# Patient Record
Sex: Female | Born: 1955
Health system: Southern US, Community
[De-identification: ages and names within clinical notes are randomized; demographics above are authoritative.]

## PROBLEM LIST (undated history)

## (undated) DIAGNOSIS — K56609 Unspecified intestinal obstruction, unspecified as to partial versus complete obstruction: Secondary | ICD-10-CM

## (undated) DIAGNOSIS — J40 Bronchitis, not specified as acute or chronic: Secondary | ICD-10-CM

## (undated) HISTORY — PX: ABDOMINAL SURGERY: SHX537

---

## 1998-07-10 ENCOUNTER — Inpatient Hospital Stay: Admission: EM | Admit: 1998-07-10 | Discharge: 1998-07-11 | Payer: Self-pay | Admitting: Emergency Medicine

## 1998-07-10 ENCOUNTER — Encounter: Payer: Self-pay | Admitting: Emergency Medicine

## 1998-07-11 ENCOUNTER — Encounter: Payer: Self-pay | Admitting: Family Medicine

## 1999-11-10 ENCOUNTER — Ambulatory Visit (HOSPITAL_COMMUNITY): Admission: RE | Admit: 1999-11-10 | Discharge: 1999-11-10 | Payer: Self-pay | Admitting: *Deleted

## 2003-06-11 ENCOUNTER — Emergency Department (HOSPITAL_COMMUNITY): Admission: EM | Admit: 2003-06-11 | Discharge: 2003-06-11 | Payer: Self-pay | Admitting: Emergency Medicine

## 2005-07-06 ENCOUNTER — Emergency Department (HOSPITAL_COMMUNITY): Admission: EM | Admit: 2005-07-06 | Discharge: 2005-07-06 | Payer: Self-pay | Admitting: Family Medicine

## 2005-08-29 ENCOUNTER — Emergency Department (HOSPITAL_COMMUNITY): Admission: EM | Admit: 2005-08-29 | Discharge: 2005-08-29 | Payer: Self-pay | Admitting: Emergency Medicine

## 2005-08-30 ENCOUNTER — Emergency Department (HOSPITAL_COMMUNITY): Admission: EM | Admit: 2005-08-30 | Discharge: 2005-08-30 | Payer: Self-pay | Admitting: Emergency Medicine

## 2007-01-01 ENCOUNTER — Emergency Department (HOSPITAL_COMMUNITY): Admission: EM | Admit: 2007-01-01 | Discharge: 2007-01-01 | Payer: Self-pay | Admitting: Emergency Medicine

## 2010-10-01 ENCOUNTER — Emergency Department (HOSPITAL_COMMUNITY)
Admission: EM | Admit: 2010-10-01 | Discharge: 2010-10-01 | Disposition: A | Discharge status: Home / Self Care | Payer: Managed Care, Other (non HMO) | Attending: Emergency Medicine | Admitting: Emergency Medicine

## 2010-10-01 ENCOUNTER — Emergency Department (HOSPITAL_COMMUNITY): Payer: Managed Care, Other (non HMO)

## 2010-10-01 ENCOUNTER — Encounter (HOSPITAL_COMMUNITY): Payer: Self-pay | Admitting: Radiology

## 2010-10-01 DIAGNOSIS — R0609 Other forms of dyspnea: Secondary | ICD-10-CM | POA: Insufficient documentation

## 2010-10-01 DIAGNOSIS — M549 Dorsalgia, unspecified: Secondary | ICD-10-CM | POA: Insufficient documentation

## 2010-10-01 DIAGNOSIS — R0989 Other specified symptoms and signs involving the circulatory and respiratory systems: Secondary | ICD-10-CM | POA: Insufficient documentation

## 2010-10-01 DIAGNOSIS — R630 Anorexia: Secondary | ICD-10-CM | POA: Insufficient documentation

## 2010-10-01 DIAGNOSIS — R10814 Left lower quadrant abdominal tenderness: Secondary | ICD-10-CM | POA: Insufficient documentation

## 2010-10-01 DIAGNOSIS — R0602 Shortness of breath: Secondary | ICD-10-CM | POA: Insufficient documentation

## 2010-10-01 DIAGNOSIS — R109 Unspecified abdominal pain: Secondary | ICD-10-CM | POA: Insufficient documentation

## 2010-10-01 HISTORY — DX: Unspecified intestinal obstruction, unspecified as to partial versus complete obstruction: K56.609

## 2010-10-01 HISTORY — DX: Bronchitis, not specified as acute or chronic: J40

## 2010-10-01 LAB — DIFFERENTIAL
Basophils Absolute: 0 10*3/uL (ref 0.0–0.1)
Basophils Relative: 0 % (ref 0–1)
Eosinophils Absolute: 0.2 10*3/uL (ref 0.0–0.7)
Eosinophils Relative: 4 % (ref 0–5)
Lymphocytes Relative: 56 % — ABNORMAL HIGH (ref 12–46)
Lymphs Abs: 2.9 10*3/uL (ref 0.7–4.0)
Monocytes Absolute: 0.4 K/uL (ref 0.1–1.0)
Monocytes Relative: 7 % (ref 3–12)
Neutro Abs: 1.7 K/uL (ref 1.7–7.7)
Neutrophils Relative %: 32 % — ABNORMAL LOW (ref 43–77)

## 2010-10-01 LAB — COMPREHENSIVE METABOLIC PANEL WITH GFR
ALT: 27 U/L (ref 0–35)
AST: 24 U/L (ref 0–37)
BUN: 14 mg/dL (ref 6–23)
Total Protein: 7.5 g/dL (ref 6.0–8.3)

## 2010-10-01 LAB — COMPREHENSIVE METABOLIC PANEL
Albumin: 3.4 g/dL — ABNORMAL LOW (ref 3.5–5.2)
Alkaline Phosphatase: 93 U/L (ref 39–117)
CO2: 27 mEq/L (ref 19–32)
Calcium: 8.8 mg/dL (ref 8.4–10.5)
Chloride: 105 mEq/L (ref 96–112)
Creatinine, Ser: <0.47 mg/dL — ABNORMAL LOW (ref 0.50–1.10)
Glucose, Bld: 100 mg/dL — ABNORMAL HIGH (ref 70–99)
Potassium: 3.5 mEq/L (ref 3.5–5.1)
Sodium: 140 mEq/L (ref 135–145)
Total Bilirubin: 0.3 mg/dL (ref 0.3–1.2)

## 2010-10-01 LAB — URINALYSIS, ROUTINE W REFLEX MICROSCOPIC
Bilirubin Urine: NEGATIVE
Glucose, UA: NEGATIVE mg/dL
Hgb urine dipstick: NEGATIVE
Ketones, ur: 15 mg/dL — AB
Leukocytes, UA: NEGATIVE
Nitrite: NEGATIVE
Protein, ur: NEGATIVE mg/dL
Specific Gravity, Urine: 1.013 (ref 1.005–1.030)
Urobilinogen, UA: 1 mg/dL (ref 0.0–1.0)
pH: 6.5 (ref 5.0–8.0)

## 2010-10-01 LAB — CBC
HCT: 36.7 % (ref 36.0–46.0)
Hemoglobin: 12.2 g/dL (ref 12.0–15.0)
MCH: 29.5 pg (ref 26.0–34.0)
MCHC: 33.2 g/dL (ref 30.0–36.0)
MCV: 88.9 fL (ref 78.0–100.0)
Platelets: 383 K/uL (ref 150–400)
RBC: 4.13 MIL/uL (ref 3.87–5.11)
RDW: 12.1 % (ref 11.5–15.5)
WBC: 5.1 K/uL (ref 4.0–10.5)

## 2010-10-01 LAB — CK TOTAL AND CKMB (NOT AT ARMC)
CK, MB: 2.6 ng/mL (ref 0.3–4.0)
Relative Index: INVALID (ref 0.0–2.5)
Total CK: 87 U/L (ref 7–177)

## 2010-10-01 LAB — LIPASE, BLOOD: Lipase: 22 U/L (ref 11–59)

## 2010-10-01 LAB — TROPONIN I: Troponin I: <0.3 ng/mL (ref <0.30)

## 2010-10-01 MED ORDER — IOHEXOL 300 MG/ML  SOLN
80.0000 mL | Freq: Once | INTRAMUSCULAR | Status: AC | PRN
  Administered 2010-10-01: 80 mL via INTRAVENOUS

## 2011-03-24 ENCOUNTER — Emergency Department (HOSPITAL_COMMUNITY)
Admission: EM | Admit: 2011-03-24 | Discharge: 2011-03-24 | Disposition: A | Discharge status: Home / Self Care | Payer: Managed Care, Other (non HMO) | Attending: Emergency Medicine | Admitting: Emergency Medicine

## 2011-03-24 ENCOUNTER — Emergency Department (HOSPITAL_COMMUNITY): Payer: Managed Care, Other (non HMO)

## 2011-03-24 ENCOUNTER — Encounter (HOSPITAL_COMMUNITY): Payer: Self-pay | Admitting: Emergency Medicine

## 2011-03-24 DIAGNOSIS — R1310 Dysphagia, unspecified: Secondary | ICD-10-CM | POA: Insufficient documentation

## 2011-03-24 DIAGNOSIS — R059 Cough, unspecified: Secondary | ICD-10-CM | POA: Insufficient documentation

## 2011-03-24 DIAGNOSIS — J40 Bronchitis, not specified as acute or chronic: Secondary | ICD-10-CM

## 2011-03-24 DIAGNOSIS — R079 Chest pain, unspecified: Secondary | ICD-10-CM | POA: Insufficient documentation

## 2011-03-24 DIAGNOSIS — R51 Headache: Secondary | ICD-10-CM | POA: Insufficient documentation

## 2011-03-24 DIAGNOSIS — J329 Chronic sinusitis, unspecified: Secondary | ICD-10-CM

## 2011-03-24 DIAGNOSIS — J3489 Other specified disorders of nose and nasal sinuses: Secondary | ICD-10-CM | POA: Insufficient documentation

## 2011-03-24 DIAGNOSIS — R0682 Tachypnea, not elsewhere classified: Secondary | ICD-10-CM | POA: Insufficient documentation

## 2011-03-24 DIAGNOSIS — R630 Anorexia: Secondary | ICD-10-CM | POA: Insufficient documentation

## 2011-03-24 DIAGNOSIS — R05 Cough: Secondary | ICD-10-CM | POA: Insufficient documentation

## 2011-03-24 DIAGNOSIS — R0602 Shortness of breath: Secondary | ICD-10-CM | POA: Insufficient documentation

## 2011-03-24 DIAGNOSIS — R599 Enlarged lymph nodes, unspecified: Secondary | ICD-10-CM | POA: Insufficient documentation

## 2011-03-24 MED ORDER — ALBUTEROL SULFATE HFA 108 (90 BASE) MCG/ACT IN AERS
2.0000 | INHALATION_SPRAY | Freq: Once | RESPIRATORY_TRACT | Status: AC
  Administered 2011-03-24: 2 via RESPIRATORY_TRACT
  Filled 2011-03-24: qty 6.7

## 2011-03-24 MED ORDER — AMOXICILLIN-POT CLAVULANATE 875-125 MG PO TABS
1.0000 | ORAL_TABLET | ORAL | Status: AC
  Administered 2011-03-24: 1 via ORAL
  Filled 2011-03-24: qty 1

## 2011-03-24 MED ORDER — HYDROCOD POLST-CHLORPHEN POLST 10-8 MG/5ML PO LQCR
5.0000 mL | Freq: Once | ORAL | Status: AC
  Administered 2011-03-24: 5 mL via ORAL
  Filled 2011-03-24: qty 5

## 2011-03-24 MED ORDER — AMOXICILLIN-POT CLAVULANATE 875-125 MG PO TABS: 1.0000 | ORAL_TABLET | Freq: Two times a day (BID) | ORAL | Status: AC

## 2011-03-24 MED ORDER — HYDROCOD POLST-CHLORPHEN POLST 10-8 MG/5ML PO LQCR: 5.0000 mL | Freq: Two times a day (BID) | ORAL | Status: DC | PRN

## 2011-03-24 NOTE — ED Provider Notes (Signed)
History     CSN: [id number] Arrival date & time: 03/24/2011  9:52 PM   First MD Initiated Contact with Patient 03/24/11 2308      Chief Complaint  Patient presents with  . Shortness of Breath    (Consider location/radiation/quality/duration/timing/severity/associated sxs/prior treatment) HPI Comments: A level V caveat applies to 2 language barrier. History of this is obtained from the patient's daughter who speaks fluent English and in her knees. For proximally one week the patient has been having worsening chest pain, worse with coughing. She has a productive cough as well as nasal drainage. She has pain in her face, nose, sinuses also worse with coughing. She was trying some DayQuil recently without any significant relief. Unsure of fevers, there is poor appetite and patient reports pain with trying to swallow food therefore has been only drinking liquids. There is a prior history of bronchitis in the past. Patient does not smoke cigarettes. Patient reports chest pain is improved with laying flat and gets worse with sitting up. Pain does not radiate to stomach or back. Patient also complains of generalized headache. There is no nausea vomiting or diarrhea.  Patient is a 55 y.o. female presenting with shortness of breath. The history is provided by the patient and a relative. The history is limited by a language barrier.  Shortness of Breath  Associated symptoms include cough and shortness of breath.    Past Medical History  Diagnosis Date  . Bowel obstruction   . Bronchitis     History reviewed. No pertinent past surgical history.  History reviewed. No pertinent family history.  History  Substance Use Topics  . Smoking status: Never Smoker   . Smokeless tobacco: Not on file  . Alcohol Use: No    OB History    Grav Para Term Preterm Abortions TAB SAB Ect Mult Living                  Review of Systems  Constitutional: Positive for appetite change.  HENT: Positive for  congestion, trouble swallowing and sinus pressure.   Respiratory: Positive for cough and shortness of breath.   Gastrointestinal: Negative for nausea, vomiting and diarrhea.  Neurological: Positive for headaches.    Allergies  Review of patient's allergies indicates no known allergies.  Home Medications   Current Outpatient Rx  Name Route Sig Dispense Refill  . PSEUDOEPHEDRINE-APAP-DM 60-650-20 MG/30ML PO LIQD Oral Take 15 mLs by mouth daily as needed. For cough per daughter       BP 107/64  Pulse 107  Temp(Src) 98.1 F (36.7 C) (Oral)  Resp 20  SpO2 97%  Physical Exam  Vitals reviewed. Constitutional: She is oriented to person, place, and time. She appears well-developed and well-nourished.  HENT:  Head: Normocephalic and atraumatic.  Nose: Mucosal edema, rhinorrhea and sinus tenderness present. No septal deviation or nasal septal hematoma. No epistaxis. Right sinus exhibits maxillary sinus tenderness and frontal sinus tenderness. Left sinus exhibits maxillary sinus tenderness and frontal sinus tenderness.  Mouth/Throat: Uvula is midline and oropharynx is clear and moist. Mucous membranes are dry and not cyanotic.  Cardiovascular: Regular rhythm, S1 normal, S2 normal and normal pulses.  Exam reveals no gallop and no friction rub.   No murmur heard. Pulmonary/Chest: No accessory muscle usage. Tachypnea noted. No respiratory distress. She has no decreased breath sounds. She has no wheezes. She has no rhonchi. She has no rales.       + coughing  Abdominal: Soft. Normal appearance. There is  no rebound and no guarding.  Musculoskeletal: Normal range of motion. She exhibits no edema and no tenderness.  Lymphadenopathy:    She has cervical adenopathy.  Neurological: She is alert and oriented to person, place, and time.  Skin: No rash noted.    ED Course  Procedures (including critical care time)  Labs Reviewed - No data to display Dg Chest 2 View  03/24/2011  *RADIOLOGY  REPORT*  Clinical Data: Shortness of breath with cough and fever.  CHEST - 2 VIEW  Comparison: 01/01/2007.  Chest CT from 10/01/2010.  Findings: The lungs are clear without focal infiltrate, edema, pneumothorax or pleural effusion. Interstitial markings are diffusely coarsened with chronic features. The cardiopericardial silhouette is within normal limits for size. Bones are diffusely demineralized.  IMPRESSION: No acute cardiopulmonary findings.  Original Report Authenticated By: ERIC A. MANSELL, M.D.     No diagnosis found.    MDM    Pt with evidence of upper respiratory infection . I reviewed plain films, not good inspiration, per radiologist cannot exclude effusion, however history and exam do not support pericardial effusion.  Pt's symptoms are worse with coughing so I think it is musculoskeletal.  Pt with sinus symptoms for more than 1 week, will start on augmentin, will also provide inhaler and tussionex for symptoms.  VS are ok, RA sats are 97%, which is normal.  Pt appears mildly dehydrated, but pt is drinking fluids, I don't think IVF's are necessary at this time.          Gavin Pound. Oletta Lamas, MD 03/24/11 [id number]

## 2011-03-24 NOTE — ED Notes (Signed)
PT. REPORTS PROGRESSING SOB WITH PRODUCTIVE COUGH AND FEVER FOR SEVERAL DAYS . DAUGHTER TRANSLATING FOR PT- [place].

## 2011-03-24 NOTE — Discharge Instructions (Signed)
 Use the inhaler 2 puffs every 6 hours for coughing and shortness of breath.  Take antibiotics for full 10 days as prescribed. Tussionex contains hydrocodone which is narcotic. It can cause drowsiness and sometimes constipation so use with caution and take precautions.

## 2011-07-30 ENCOUNTER — Emergency Department (HOSPITAL_COMMUNITY): Payer: Managed Care, Other (non HMO)

## 2011-07-30 ENCOUNTER — Encounter (HOSPITAL_COMMUNITY): Payer: Self-pay | Admitting: Emergency Medicine

## 2011-07-30 ENCOUNTER — Emergency Department (HOSPITAL_COMMUNITY)
Admission: EM | Admit: 2011-07-30 | Discharge: 2011-07-30 | Disposition: A | Discharge status: Home / Self Care | Payer: Managed Care, Other (non HMO) | Attending: Emergency Medicine | Admitting: Emergency Medicine

## 2011-07-30 DIAGNOSIS — R0989 Other specified symptoms and signs involving the circulatory and respiratory systems: Secondary | ICD-10-CM | POA: Insufficient documentation

## 2011-07-30 DIAGNOSIS — R093 Abnormal sputum: Secondary | ICD-10-CM | POA: Insufficient documentation

## 2011-07-30 DIAGNOSIS — R05 Cough: Secondary | ICD-10-CM | POA: Insufficient documentation

## 2011-07-30 DIAGNOSIS — R059 Cough, unspecified: Secondary | ICD-10-CM | POA: Insufficient documentation

## 2011-07-30 DIAGNOSIS — R062 Wheezing: Secondary | ICD-10-CM | POA: Insufficient documentation

## 2011-07-30 DIAGNOSIS — J40 Bronchitis, not specified as acute or chronic: Secondary | ICD-10-CM | POA: Insufficient documentation

## 2011-07-30 DIAGNOSIS — R0602 Shortness of breath: Secondary | ICD-10-CM | POA: Insufficient documentation

## 2011-07-30 DIAGNOSIS — R0609 Other forms of dyspnea: Secondary | ICD-10-CM | POA: Insufficient documentation

## 2011-07-30 MED ORDER — ALBUTEROL SULFATE (5 MG/ML) 0.5% IN NEBU
INHALATION_SOLUTION | RESPIRATORY_TRACT | Status: AC
  Filled 2011-07-30: qty 1

## 2011-07-30 MED ORDER — ALBUTEROL SULFATE (5 MG/ML) 0.5% IN NEBU
5.0000 mg | INHALATION_SOLUTION | Freq: Once | RESPIRATORY_TRACT | Status: AC
  Administered 2011-07-30: 5 mg via RESPIRATORY_TRACT

## 2011-07-30 MED ORDER — IPRATROPIUM BROMIDE 0.02 % IN SOLN
0.5000 mg | Freq: Once | RESPIRATORY_TRACT | Status: AC
  Administered 2011-07-30: 0.5 mg via RESPIRATORY_TRACT
  Filled 2011-07-30: qty 2.5

## 2011-07-30 MED ORDER — SULFAMETHOXAZOLE-TRIMETHOPRIM 800-160 MG PO TABS: 1.0000 | ORAL_TABLET | Freq: Two times a day (BID) | ORAL | Status: DC

## 2011-07-30 MED ORDER — PREDNISONE 20 MG PO TABS
60.0000 mg | ORAL_TABLET | Freq: Once | ORAL | Status: AC
  Administered 2011-07-30: 60 mg via ORAL
  Filled 2011-07-30: qty 3

## 2011-07-30 MED ORDER — ALBUTEROL SULFATE (5 MG/ML) 0.5% IN NEBU
2.5000 mg | INHALATION_SOLUTION | Freq: Once | RESPIRATORY_TRACT | Status: AC
  Administered 2011-07-30: 2.5 mg via RESPIRATORY_TRACT
  Filled 2011-07-30: qty 0.5

## 2011-07-30 MED ORDER — PREDNISONE 20 MG PO TABS: 60.0000 mg | ORAL_TABLET | Freq: Every day | ORAL | Status: DC

## 2011-07-30 MED ORDER — ALBUTEROL SULFATE HFA 108 (90 BASE) MCG/ACT IN AERS
2.0000 | INHALATION_SPRAY | RESPIRATORY_TRACT | Status: DC | PRN
  Administered 2011-07-30: 2 via RESPIRATORY_TRACT
  Filled 2011-07-30: qty 6.7

## 2011-07-30 NOTE — ED Notes (Signed)
Pt [place], pt okay's for son to translate; pt started to have SOB 10 pm; son reports that she is not on any inhalers; pt appears labored, insp wheezing, pt reports midsternal CP, sore throat, denies fevers, pt having productive cough

## 2011-07-30 NOTE — ED Notes (Signed)
Patient with shortness of breath, insp wheezing and exp wheezing on the left.  Patient is CAOx3, does not speak english.  Patient's son states she started with shortness of breath around 10pm, increasingly getting more short of breath.

## 2011-07-30 NOTE — ED Provider Notes (Signed)
History     CSN: [id number]  Arrival date & time 07/30/11  0142   First MD Initiated Contact with Patient 07/30/11 0216      Chief Complaint  Patient presents with  . Shortness of Breath    (Consider location/radiation/quality/duration/timing/severity/associated sxs/prior treatment) Patient is a 56 y.o. female presenting with shortness of breath. The history is provided by a relative.  Shortness of Breath  Associated symptoms include cough and shortness of breath. Pertinent negatives include no chest pain and no fever.   the patient is a 56 year old, female, with no significant past medical history.  She does not smoke.  She presents to the emergency department complaining of a cough with productive sputum since around 10 PM.  This evening.  She also shortness of breath.  She says her sputum is green.  She has not had a fever, or chills.  She denies chest pain.  Past Medical History  Diagnosis Date  . Bowel obstruction   . Bronchitis     Past Surgical History  Procedure Date  . Abdominal surgery     History reviewed. No pertinent family history.  History  Substance Use Topics  . Smoking status: Never Smoker   . Smokeless tobacco: Not on file  . Alcohol Use: No    OB History    Grav Para Term Preterm Abortions TAB SAB Ect Mult Living                  Review of Systems  Constitutional: Negative for fever and chills.  HENT: Negative for congestion.   Respiratory: Positive for cough and shortness of breath. Negative for chest tightness.   Cardiovascular: Negative for chest pain and leg swelling.  Neurological: Negative for headaches.  Psychiatric/Behavioral: Negative for confusion.  All other systems reviewed and are negative.    Allergies  Review of patient's allergies indicates no known allergies.  Home Medications   Current Outpatient Rx  Name Route Sig Dispense Refill  . OVER THE COUNTER MEDICATION Oral Take 1 tablet by mouth daily as needed. For  allergies/sleep  Over the counter allergy relief      BP 127/78  Pulse 99  Temp(Src) 97.5 F (36.4 C) (Oral)  Resp 24  SpO2 90%  Physical Exam  Vitals reviewed. Constitutional: She is oriented to person, place, and time. She appears well-developed and well-nourished. She appears distressed.  HENT:  Head: Normocephalic and atraumatic.  Eyes: Conjunctivae are normal.  Neck: Normal range of motion. Neck supple.  Cardiovascular: Normal rate and regular rhythm.   Pulmonary/Chest: She is in respiratory distress. She has wheezes. She has no rales.  Abdominal: She exhibits no distension.  Musculoskeletal: Normal range of motion. She exhibits no edema.  Neurological: She is alert and oriented to person, place, and time.  Skin: Skin is warm and dry.  Psychiatric: She has a normal mood and affect. Thought content normal.    ED Course  Procedures (including critical care time) Asthma with respiratory distress and room air, hypoxia.  We'll perform a chest x-ray, and give nebulizer treatments and steroids.  Labs Reviewed - No data to display No results found.   No diagnosis found.  2:52 AM Feels better after nebs  3:45 AM Lungs cta now. No resp distress. Feels better.  MDM  Bronchitis No resp distress        Cheri Guppy, MD 07/30/11 [phone]

## 2011-07-30 NOTE — ED Notes (Signed)
Pt started on protocol nebulizer at triage and tx to pod9 by rn

## 2011-07-30 NOTE — Discharge Instructions (Signed)
Your chest x-ray, shows that you may have bronchitis or early pneumonia.  Use albuterol for recurrent wheezing and shortness of breath.  Use prednisone to reduce inflammation in your lungs and reduce symptoms.  Use Bactrim for infection.  Followup with your Dr. if your symptoms.  Do not improve after 3-4 days.  Return for worse or uncontrolled symptoms.  You should have a repeat chest x-ray, in one month to see if everything has resolved or if there is any progression of lymph nodes in your lungs

## 2011-07-31 ENCOUNTER — Emergency Department (HOSPITAL_COMMUNITY)
Admission: EM | Admit: 2011-07-31 | Discharge: 2011-07-31 | Disposition: A | Discharge status: Home / Self Care | Payer: Managed Care, Other (non HMO) | Attending: Emergency Medicine | Admitting: Emergency Medicine

## 2011-07-31 ENCOUNTER — Emergency Department (HOSPITAL_COMMUNITY): Payer: Managed Care, Other (non HMO)

## 2011-07-31 ENCOUNTER — Encounter (HOSPITAL_COMMUNITY): Payer: Self-pay | Admitting: Emergency Medicine

## 2011-07-31 ENCOUNTER — Other Ambulatory Visit: Payer: Self-pay

## 2011-07-31 DIAGNOSIS — J40 Bronchitis, not specified as acute or chronic: Secondary | ICD-10-CM | POA: Insufficient documentation

## 2011-07-31 DIAGNOSIS — J9801 Acute bronchospasm: Secondary | ICD-10-CM | POA: Insufficient documentation

## 2011-07-31 LAB — DIFFERENTIAL
Basophils Absolute: 0.1 10*3/uL (ref 0.0–0.1)
Lymphocytes Relative: 53 % — ABNORMAL HIGH (ref 12–46)
Monocytes Absolute: 0.4 10*3/uL (ref 0.1–1.0)
Neutro Abs: 2.2 10*3/uL (ref 1.7–7.7)
Neutrophils Relative %: 32 % — ABNORMAL LOW (ref 43–77)

## 2011-07-31 LAB — BASIC METABOLIC PANEL
CO2: 26 mEq/L (ref 19–32)
Chloride: 105 mEq/L (ref 96–112)
Creatinine, Ser: 0.54 mg/dL (ref 0.50–1.10)
GFR calc Af Amer: >90 mL/min (ref >90)
Potassium: 3.5 mEq/L (ref 3.5–5.1)
Sodium: 142 mEq/L (ref 135–145)

## 2011-07-31 LAB — CBC
HCT: 37.8 % (ref 36.0–46.0)
RDW: 13 % (ref 11.5–15.5)
WBC: 6.8 10*3/uL (ref 4.0–10.5)

## 2011-07-31 MED ORDER — METHYLPREDNISOLONE SODIUM SUCC 125 MG IJ SOLR
125.0000 mg | Freq: Once | INTRAMUSCULAR | Status: AC
  Administered 2011-07-31: 125 mg via INTRAVENOUS
  Filled 2011-07-31: qty 2

## 2011-07-31 MED ORDER — ACETAMINOPHEN-CODEINE 120-12 MG/5ML PO SOLN: 10.0000 mL | Freq: Four times a day (QID) | ORAL | Status: AC | PRN

## 2011-07-31 MED ORDER — ALBUTEROL SULFATE (5 MG/ML) 0.5% IN NEBU
2.5000 mg | INHALATION_SOLUTION | Freq: Once | RESPIRATORY_TRACT | Status: AC
  Administered 2011-07-31: 2.5 mg via RESPIRATORY_TRACT

## 2011-07-31 MED ORDER — IPRATROPIUM BROMIDE 0.02 % IN SOLN
0.5000 mg | Freq: Once | RESPIRATORY_TRACT | Status: AC
  Administered 2011-07-31: 0.5 mg via RESPIRATORY_TRACT
  Filled 2011-07-31: qty 2.5

## 2011-07-31 MED ORDER — IPRATROPIUM BROMIDE 0.02 % IN SOLN
0.5000 mg | Freq: Once | RESPIRATORY_TRACT | Status: AC
  Administered 2011-07-31: 0.5 mg via RESPIRATORY_TRACT

## 2011-07-31 MED ORDER — IPRATROPIUM BROMIDE 0.02 % IN SOLN
RESPIRATORY_TRACT | Status: AC
  Filled 2011-07-31: qty 2.5

## 2011-07-31 MED ORDER — ALBUTEROL SULFATE (5 MG/ML) 0.5% IN NEBU
INHALATION_SOLUTION | RESPIRATORY_TRACT | Status: AC
  Filled 2011-07-31: qty 0.5

## 2011-07-31 MED ORDER — ALBUTEROL SULFATE (5 MG/ML) 0.5% IN NEBU
2.5000 mg | INHALATION_SOLUTION | Freq: Once | RESPIRATORY_TRACT | Status: AC
  Administered 2011-07-31: 2.5 mg via RESPIRATORY_TRACT
  Filled 2011-07-31: qty 0.5

## 2011-07-31 NOTE — ED Notes (Addendum)
Pt ambulated in hall without difficulty, pt O2 sat on room air while ambulating was 97% HR 117, pt coughing while walking O2 sat remained above 94% HR increased to 125 bpm

## 2011-07-31 NOTE — ED Notes (Signed)
Patient did not pick up e-prescribed medications from last visit; has not been taking medications.

## 2011-07-31 NOTE — ED Provider Notes (Signed)
History     CSN: [id number]  Arrival date & time 07/31/11  0704   First MD Initiated Contact with Patient 07/31/11 [phone]      Chief Complaint  Patient presents with  . Shortness of Breath    (Consider location/radiation/quality/duration/timing/severity/associated sxs/prior treatment) Patient is a 56 y.o. female presenting with shortness of breath. The history is provided by the patient and a relative.  Shortness of Breath  Associated symptoms include shortness of breath.  She has had a cough for the last 2 days. Cough was initially productive of green sputum but is now nonproductive. There has been associated dyspnea and wheezing. In any fever, chills, sweats. She denies nausea, vomiting, diarrhea. She denies arthralgias and myalgias. There's been no known sick contacts. She was in the emergency department yesterday and was given an albuterol inhaler which has not been helping. She was given prescriptions but has not had them filled.  Past Medical History  Diagnosis Date  . Bowel obstruction   . Bronchitis     Past Surgical History  Procedure Date  . Abdominal surgery     History reviewed. No pertinent family history.  History  Substance Use Topics  . Smoking status: Never Smoker   . Smokeless tobacco: Not on file  . Alcohol Use: No    OB History    Grav Para Term Preterm Abortions TAB SAB Ect Mult Living                  Review of Systems  Respiratory: Positive for shortness of breath.   All other systems reviewed and are negative.    Allergies  Review of patient's allergies indicates no known allergies.  Home Medications   Current Outpatient Rx  Name Route Sig Dispense Refill  . OVER THE COUNTER MEDICATION Oral Take 1 tablet by mouth daily as needed. For allergies/sleep  Over the counter allergy relief      BP 133/78  Pulse 101  Temp(Src) 98.3 F (36.8 C) (Oral)  Resp 22  SpO2 90%  Physical Exam  Nursing note and vitals reviewed.  T41-year-old  female who appears mildly dyspneic. Vital signs are significant for mild tachycardia heart rate 101, mild tachypnea with respiratory rate of 22. Saturation is 90% which is hypoxic. Normocephalic and atraumatic. PERRLA, EOMI. Oropharynx is clear. Neck is nontender and supple without adenopathy or JVD. Back is nontender. Lungs have diffuse inspiratory and expiratory wheezes and she is using some accessory muscles of respiration. Heart has regular rate and rhythm without murmur. Abdomen is soft, flat, nontender without masses or hepatosplenomegaly. Extremities have full range of motion, no cyanosis or edema. Skin is warm and dry without rash. Neurologic: Mental status is normal, cranial nerves are intact, there are no focal motor or sensory deficits.  ED Course  Procedures (including critical care time)  Results for orders placed during the hospital encounter of 07/31/11  CBC      Component Value Range   WBC 6.8  4.0 - 10.5 (K/uL)   RBC 4.17  3.87 - 5.11 (MIL/uL)   Hemoglobin 12.6  12.0 - 15.0 (g/dL)   HCT 11.9  14.7 - 82.9 (%)   MCV 90.6  78.0 - 100.0 (fL)   MCH 30.2  26.0 - 34.0 (pg)   MCHC 33.3  30.0 - 36.0 (g/dL)   RDW 56.2  13.0 - 86.5 (%)   Platelets 331  150 - 400 (K/uL)  DIFFERENTIAL      Component Value Range   Neutrophils  Relative 32 (*) 43 - 77 (%)   Neutro Abs 2.2  1.7 - 7.7 (K/uL)   Lymphocytes Relative 53 (*) 12 - 46 (%)   Lymphs Abs 3.6  0.7 - 4.0 (K/uL)   Monocytes Relative 6  3 - 12 (%)   Monocytes Absolute 0.4  0.1 - 1.0 (K/uL)   Eosinophils Relative 8 (*) 0 - 5 (%)   Eosinophils Absolute 0.5  0.0 - 0.7 (K/uL)   Basophils Relative 1  0 - 1 (%)   Basophils Absolute 0.1  0.0 - 0.1 (K/uL)  BASIC METABOLIC PANEL      Component Value Range   Sodium 142  135 - 145 (mEq/L)   Potassium 3.5  3.5 - 5.1 (mEq/L)   Chloride 105  96 - 112 (mEq/L)   CO2 26  19 - 32 (mEq/L)   Glucose, Bld 123 (*) 70 - 99 (mg/dL)   BUN 17  6 - 23 (mg/dL)   Creatinine, Ser 1.61  0.50 - 1.10 (mg/dL)     Calcium 9.0  8.4 - 10.5 (mg/dL)   GFR calc non Af Amer >90  >90 (mL/min)   GFR calc Af Amer >90  >90 (mL/min)   Dg Chest 2 View  07/31/2011  *RADIOLOGY REPORT*  Clinical Data: Shortness of breath.  Difficulty breathing.  CHEST - 2 VIEW  Comparison: 07/30/2011  Findings: Cardiomediastinal silhouette is within normal limits. There is perihilar bronchitic change.  No focal consolidations or pleural effusions are identified. Visualized osseous structures have a normal appearance.  IMPRESSION:  1. No focal pulmonary abnormality. 2.  Bronchitic changes.  Original Report Authenticated By: Patterson Hammersmith, M.D.   ECG shows normal sinus rhythm with a rate of 100, no ectopy. Normal axis. Normal P wave. Normal QRS. Normal intervals. Normal ST and T waves. Impression: normal ECG. When compared with ECG of 07/30/2011, no significant changes are seen.  0940: Lungs are completely clear but she states she still feels like there is a little difficulty breathing. X-ray is negative for infiltrate and WBC has a right shift suggesting a viral illness. Oxygen saturation on nasal cannula is 100%. At this point, I will reevaluate to see what her oxygen saturation is on room air.  She was given a second albuterol nebulizer treatment and is resting comfortably. Oxygen sats saturation on room air was 98%. She was ambulated and oxygen saturation did not drop below 94%. She is requesting a prescription for a cough suppressant. She is advised to make sure she picks up the prednisone prescription she was given yesterday and start taking that tomorrow, and is given a prescription for acetaminophen with codeine elixir for cough.  1. Bronchospasm   2. Bronchitis       MDM   ED chart was reviewed and she had been prescribed Bactrim and prednisone but apparently these have not been picked up. She had been given an albuterol inhaler in the emergency department. Chest x-ray was read as showing questionable perihilar infiltrate.  He has significant/and hypoxia. She will be given additional steroids to nebulizer treatments and evaluated for possible admission.        Dione Booze, MD 07/31/11 1309

## 2011-07-31 NOTE — Discharge Instructions (Signed)
Make sure to pick up the medication you were prescribed yesterday!  Bronchospasm, Adult Bronchospasm means that there is a spasm or tightening of the airways going into the lungs. Because the airways go into a spasm and get smaller it makes breathing more difficult. For reasons not completely known, workings (functions) of the airways designed to protect the lungs become over active. This causes the airways to become more sensitive to:  Infection.   Weather.   Exercise.   Irritants.   Things that cause allergic reactions or allergies (allergens).  Frequent coughing or respiratory episodes should be checked for the cause. This condition may be made worse by exercise. CAUSES  Inflammation is often the cause of this condition. Allergy, viral respiratory infections, or irritants in the air often cause this problem. Allergic reactions produce immediate and delayed responses. Late reactions may produce more serious inflammation. This may lead to increased reactivity of the airways. Sometimes this is inherited. Some common triggers are:  Allergies.   Infection commonly triggers attacks. Antibiotics are not helpful for viral infections and usually do not help with attacks of bronchospasm.   Exercise (running, etc.) can trigger an attack. Proper pre-exercise medications help most individuals participate in sports. Swimming is the least likely sport to cause problems.   Irritants (for example, pollution, cigarette smoke, strong odors, aerosol sprays, paint fumes, etc.) may trigger attacks. You cannot smoke and do not allow smoking in your home. This is absolutely necessary. Show this instruction to mates, relatives and significant others that may not agree with you.   Weather changes may cause lung problems but moving around trying to find an ideal climate does not seem to be overly helpful. Winds increase molds and pollens in the air. Rain refreshes the air by washing irritants out. Cold air may  cause irritation.   Emotional problems do not cause lung problems but can trigger attacks.  SYMPTOMS  Wheezing is the most common symptom. Frequent coughing (with or without exercise and or crying) and repeated respiratory infections are all early warning signs of bronchospasm. Chest tightness and shortness of breath are other symptoms. DIAGNOSIS  Early hidden bronchospasm may go for long periods of time without being detected. This is especially true if wheezing cannot be detected by your caregiver. Lung (pulmonary) function studies may help with diagnosis in these cases. HOME CARE INSTRUCTIONS   It is necessary to remain calm during an attack. Try to relax and breathe more slowly. During this time medications may be given. If any breathing problems seem to be getting worse and are unresponsive to treatment seek immediate medical care.   If you have severe breathing difficulty or have had a life threatening attack it is probably a good idea for you to learn how to give adrenaline (epi-pen) or use an anaphylaxis kit. Your caregiver can help you with this. These are the same kits carried by people who have severe allergic reactions. This is especially important if you do not have readily accessible medical care.   With any severe breathing problems where epinephrine (adrenaline) has been given at home call 911 immediately as the delayed reaction may be even more severe.  SEEK MEDICAL CARE IF:   There is wheezing and shortness of breath, even if medications are given to prevent attacks.   An oral temperature above 102 F (38.9 C) develops.   There are muscle aches, chest pain, or thickening of sputum.   The sputum changes from clear or white to yellow, green, gray,  or bloody.   There are problems that may be related to the medicine you are given, such as a rash, itching, swelling, or trouble breathing.  SEEK IMMEDIATE MEDICAL CARE IF:   The usual medicines do not stop your wheezing, or  there is increased coughing.   You have increased difficulty breathing.  MAKE SURE YOU:   Understand these instructions.   Will watch your condition.   Will get help right away if you are not doing well or get worse.  Document Released: 03/31/2003 Document Revised: 03/17/2011 Document Reviewed: 11/14/2007 [facility] Patient Information 2012 [place], [place].

## 2011-07-31 NOTE — ED Notes (Addendum)
Patient complaining of shortness of breath since Friday; was seen in the ED on Friday.  Patient states symptoms are not better; 90% O2 on RA.  Patient also reports pain chest pain when coughing; reports productive cough.  Primary language [place] -- family member at bedside to translate.

## 2011-08-10 ENCOUNTER — Other Ambulatory Visit: Payer: Self-pay | Admitting: Internal Medicine

## 2011-08-16 ENCOUNTER — Other Ambulatory Visit: Payer: Self-pay

## 2011-08-16 ENCOUNTER — Emergency Department (HOSPITAL_COMMUNITY): Payer: Managed Care, Other (non HMO)

## 2011-08-16 ENCOUNTER — Encounter (HOSPITAL_COMMUNITY): Payer: Self-pay | Admitting: Emergency Medicine

## 2011-08-16 ENCOUNTER — Emergency Department (HOSPITAL_COMMUNITY)
Admission: EM | Admit: 2011-08-16 | Discharge: 2011-08-16 | Disposition: A | Discharge status: Home / Self Care | Payer: Managed Care, Other (non HMO) | Attending: Emergency Medicine | Admitting: Emergency Medicine

## 2011-08-16 DIAGNOSIS — R0602 Shortness of breath: Secondary | ICD-10-CM | POA: Insufficient documentation

## 2011-08-16 DIAGNOSIS — R05 Cough: Secondary | ICD-10-CM | POA: Insufficient documentation

## 2011-08-16 DIAGNOSIS — J40 Bronchitis, not specified as acute or chronic: Secondary | ICD-10-CM | POA: Insufficient documentation

## 2011-08-16 DIAGNOSIS — R059 Cough, unspecified: Secondary | ICD-10-CM | POA: Insufficient documentation

## 2011-08-16 DIAGNOSIS — R079 Chest pain, unspecified: Secondary | ICD-10-CM | POA: Insufficient documentation

## 2011-08-16 LAB — DIFFERENTIAL
Eosinophils Relative: 11 % — ABNORMAL HIGH (ref 0–5)
Lymphocytes Relative: 47 % — ABNORMAL HIGH (ref 12–46)
Lymphs Abs: 3.9 10*3/uL (ref 0.7–4.0)
Monocytes Absolute: 0.7 10*3/uL (ref 0.1–1.0)
Monocytes Relative: 8 % (ref 3–12)
Neutro Abs: 2.7 10*3/uL (ref 1.7–7.7)

## 2011-08-16 LAB — CBC
HCT: 43.6 % (ref 36.0–46.0)
Hemoglobin: 14.9 g/dL (ref 12.0–15.0)
MCV: 89 fL (ref 78.0–100.0)
RBC: 4.9 MIL/uL (ref 3.87–5.11)
WBC: 8.2 10*3/uL (ref 4.0–10.5)

## 2011-08-16 LAB — COMPREHENSIVE METABOLIC PANEL
AST: 20 U/L (ref 0–37)
CO2: 23 mEq/L (ref 19–32)
Calcium: 9.2 mg/dL (ref 8.4–10.5)
Chloride: 100 mEq/L (ref 96–112)
Creatinine, Ser: 0.67 mg/dL (ref 0.50–1.10)
GFR calc Af Amer: >90 mL/min (ref >90)
GFR calc non Af Amer: >90 mL/min (ref >90)
Glucose, Bld: 119 mg/dL — ABNORMAL HIGH (ref 70–99)
Total Bilirubin: 0.5 mg/dL (ref 0.3–1.2)

## 2011-08-16 LAB — D-DIMER, QUANTITATIVE: D-Dimer, Quant: 0.36 ug/mL-FEU (ref 0.00–0.48)

## 2011-08-16 LAB — POCT I-STAT TROPONIN I: Troponin i, poc: 0 ng/mL (ref 0.00–0.08)

## 2011-08-16 MED ORDER — IPRATROPIUM BROMIDE 0.02 % IN SOLN
0.5000 mg | Freq: Once | RESPIRATORY_TRACT | Status: AC
  Administered 2011-08-16: 0.5 mg via RESPIRATORY_TRACT
  Filled 2011-08-16: qty 2.5

## 2011-08-16 MED ORDER — PREDNISONE 10 MG PO TABS: 20.0000 mg | ORAL_TABLET | Freq: Every day | ORAL | Status: DC

## 2011-08-16 MED ORDER — ALBUTEROL SULFATE HFA 108 (90 BASE) MCG/ACT IN AERS: 1.0000 | INHALATION_SPRAY | Freq: Four times a day (QID) | RESPIRATORY_TRACT | Status: DC | PRN

## 2011-08-16 MED ORDER — SODIUM CHLORIDE 0.9 % IV SOLN
Freq: Once | INTRAVENOUS | Status: AC
  Administered 2011-08-16: 03:00:00 via INTRAVENOUS

## 2011-08-16 MED ORDER — ALBUTEROL SULFATE (5 MG/ML) 0.5% IN NEBU
5.0000 mg | INHALATION_SOLUTION | Freq: Once | RESPIRATORY_TRACT | Status: AC
  Administered 2011-08-16: 5 mg via RESPIRATORY_TRACT
  Filled 2011-08-16: qty 1

## 2011-08-16 MED ORDER — AZITHROMYCIN 250 MG PO TABS: ORAL_TABLET | ORAL | Status: DC

## 2011-08-16 MED ORDER — AZITHROMYCIN 250 MG PO TABS: ORAL_TABLET | ORAL | Status: AC

## 2011-08-16 MED ORDER — METHYLPREDNISOLONE SODIUM SUCC 125 MG IJ SOLR
125.0000 mg | Freq: Once | INTRAMUSCULAR | Status: AC
  Administered 2011-08-16: 125 mg via INTRAVENOUS
  Filled 2011-08-16: qty 2

## 2011-08-16 NOTE — ED Provider Notes (Signed)
History     CSN: [id number]  Arrival date & time 08/16/11  0139   First MD Initiated Contact with Patient 08/16/11 [phone]      Chief Complaint  Patient presents with  . Shortness of Breath  . Chest Pain    increase with deepbreathing    (Consider location/radiation/quality/duration/timing/severity/associated sxs/prior treatment) Patient is a 56 y.o. female presenting with shortness of breath and chest pain. The history is provided by the patient and a relative.  Shortness of Breath  Associated symptoms include chest pain and shortness of breath.  Chest Pain Primary symptoms include shortness of breath.    patient here with shortness of breath x2 days associated with cough. History of bronchitis in the past and according to the patient is still slightly. No fever and cough is nonproductive. Some chest tightness with coughing, denies any pleuritic component. No leg pain or swelling. No anginal type symptoms. No medications taken for this prior to arrival  Past Medical History  Diagnosis Date  . Bowel obstruction   . Bronchitis     Past Surgical History  Procedure Date  . Abdominal surgery     History reviewed. No pertinent family history.  History  Substance Use Topics  . Smoking status: Never Smoker   . Smokeless tobacco: Not on file  . Alcohol Use: No    OB History    Grav Para Term Preterm Abortions TAB SAB Ect Mult Living                  Review of Systems  Respiratory: Positive for shortness of breath.   Cardiovascular: Positive for chest pain.  All other systems reviewed and are negative.    Allergies  Review of patient's allergies indicates no known allergies.  Home Medications  No current outpatient prescriptions on file.  BP 113/74  Temp(Src) 98.2 F (36.8 C) (Oral)  Resp 19  Physical Exam  Nursing note and vitals reviewed. Constitutional: She is oriented to person, place, and time. She appears well-developed and well-nourished.  Non-toxic  appearance. No distress.  HENT:  Head: Normocephalic and atraumatic.  Eyes: Conjunctivae, EOM and lids are normal. Pupils are equal, round, and reactive to light.  Neck: Normal range of motion. Neck supple. No tracheal deviation present. No mass present.  Cardiovascular: Normal rate, regular rhythm and normal heart sounds.  Exam reveals no gallop.   No murmur heard. Pulmonary/Chest: Effort normal. No stridor. No respiratory distress. She has no decreased breath sounds. She has wheezes. She has no rhonchi. She has no rales.  Abdominal: Soft. Normal appearance and bowel sounds are normal. She exhibits no distension. There is no tenderness. There is no rebound and no CVA tenderness.  Musculoskeletal: Normal range of motion. She exhibits no edema and no tenderness.  Neurological: She is alert and oriented to person, place, and time. She has normal strength. No cranial nerve deficit or sensory deficit. GCS eye subscore is 4. GCS verbal subscore is 5. GCS motor subscore is 6.  Skin: Skin is warm and dry. No abrasion and no rash noted.  Psychiatric: She has a normal mood and affect. Her speech is normal and behavior is normal.    ED Course  Procedures (including critical care time)   Labs Reviewed  CBC  DIFFERENTIAL  COMPREHENSIVE METABOLIC PANEL  D-DIMER, QUANTITATIVE   No results found.   No diagnosis found.    MDM  Patient given albuterol along with symmetrical. Lung exam repeated and is much improved. D-dimer negative  no concern for PE. Cardiac markers negative no concern for ACS. Suspect that patient had bronchospasm and she will be placed on medications and discharged        Toy Baker, MD 08/16/11 [phone]

## 2011-08-16 NOTE — ED Notes (Signed)
Pt reports chest pain and SOB  Pain increases with deep breathing out of medication for resp illness. Pt and family member very limited english speaking

## 2011-08-16 NOTE — ED Notes (Signed)
Pt ambulated with a steady gait;VSS; A&Ox3; no signs of distress; respirations even and unlabored; skin warm and dry; no questions at this time.  

## 2011-08-16 NOTE — ED Notes (Signed)
Patient transported to X-ray 

## 2011-08-16 NOTE — Discharge Instructions (Signed)

## 2011-11-30 ENCOUNTER — Other Ambulatory Visit: Payer: Self-pay | Admitting: Gastroenterology

## 2011-11-30 DIAGNOSIS — R109 Unspecified abdominal pain: Secondary | ICD-10-CM

## 2011-12-02 ENCOUNTER — Ambulatory Visit
Admission: RE | Admit: 2011-12-02 | Discharge: 2011-12-02 | Disposition: A | Discharge status: Home / Self Care | Payer: Managed Care, Other (non HMO) | Source: Ambulatory Visit | Attending: Gastroenterology | Admitting: Gastroenterology

## 2011-12-02 ENCOUNTER — Other Ambulatory Visit: Payer: Self-pay | Admitting: Gastroenterology

## 2011-12-02 DIAGNOSIS — R109 Unspecified abdominal pain: Secondary | ICD-10-CM

## 2012-07-26 ENCOUNTER — Ambulatory Visit
Admission: RE | Admit: 2012-07-26 | Discharge: 2012-07-26 | Disposition: A | Discharge status: Home / Self Care | Payer: Managed Care, Other (non HMO) | Source: Ambulatory Visit | Attending: Gastroenterology | Admitting: Gastroenterology

## 2012-07-26 ENCOUNTER — Other Ambulatory Visit: Payer: Self-pay | Admitting: Gastroenterology

## 2012-07-26 DIAGNOSIS — K581 Irritable bowel syndrome with constipation: Secondary | ICD-10-CM

## 2012-07-26 DIAGNOSIS — R109 Unspecified abdominal pain: Secondary | ICD-10-CM

## 2012-07-26 DIAGNOSIS — K5900 Constipation, unspecified: Secondary | ICD-10-CM

## 2013-05-02 IMAGING — CR DG CHEST 2V
2 series · 2 of 2 positions shown · non-contrast
Comparison: 01/01/2007.  Chest CT from 10/01/2010.

CLINICAL DATA: Shortness of breath with cough and fever.

CHEST - 2 VIEW

[w chest pa]
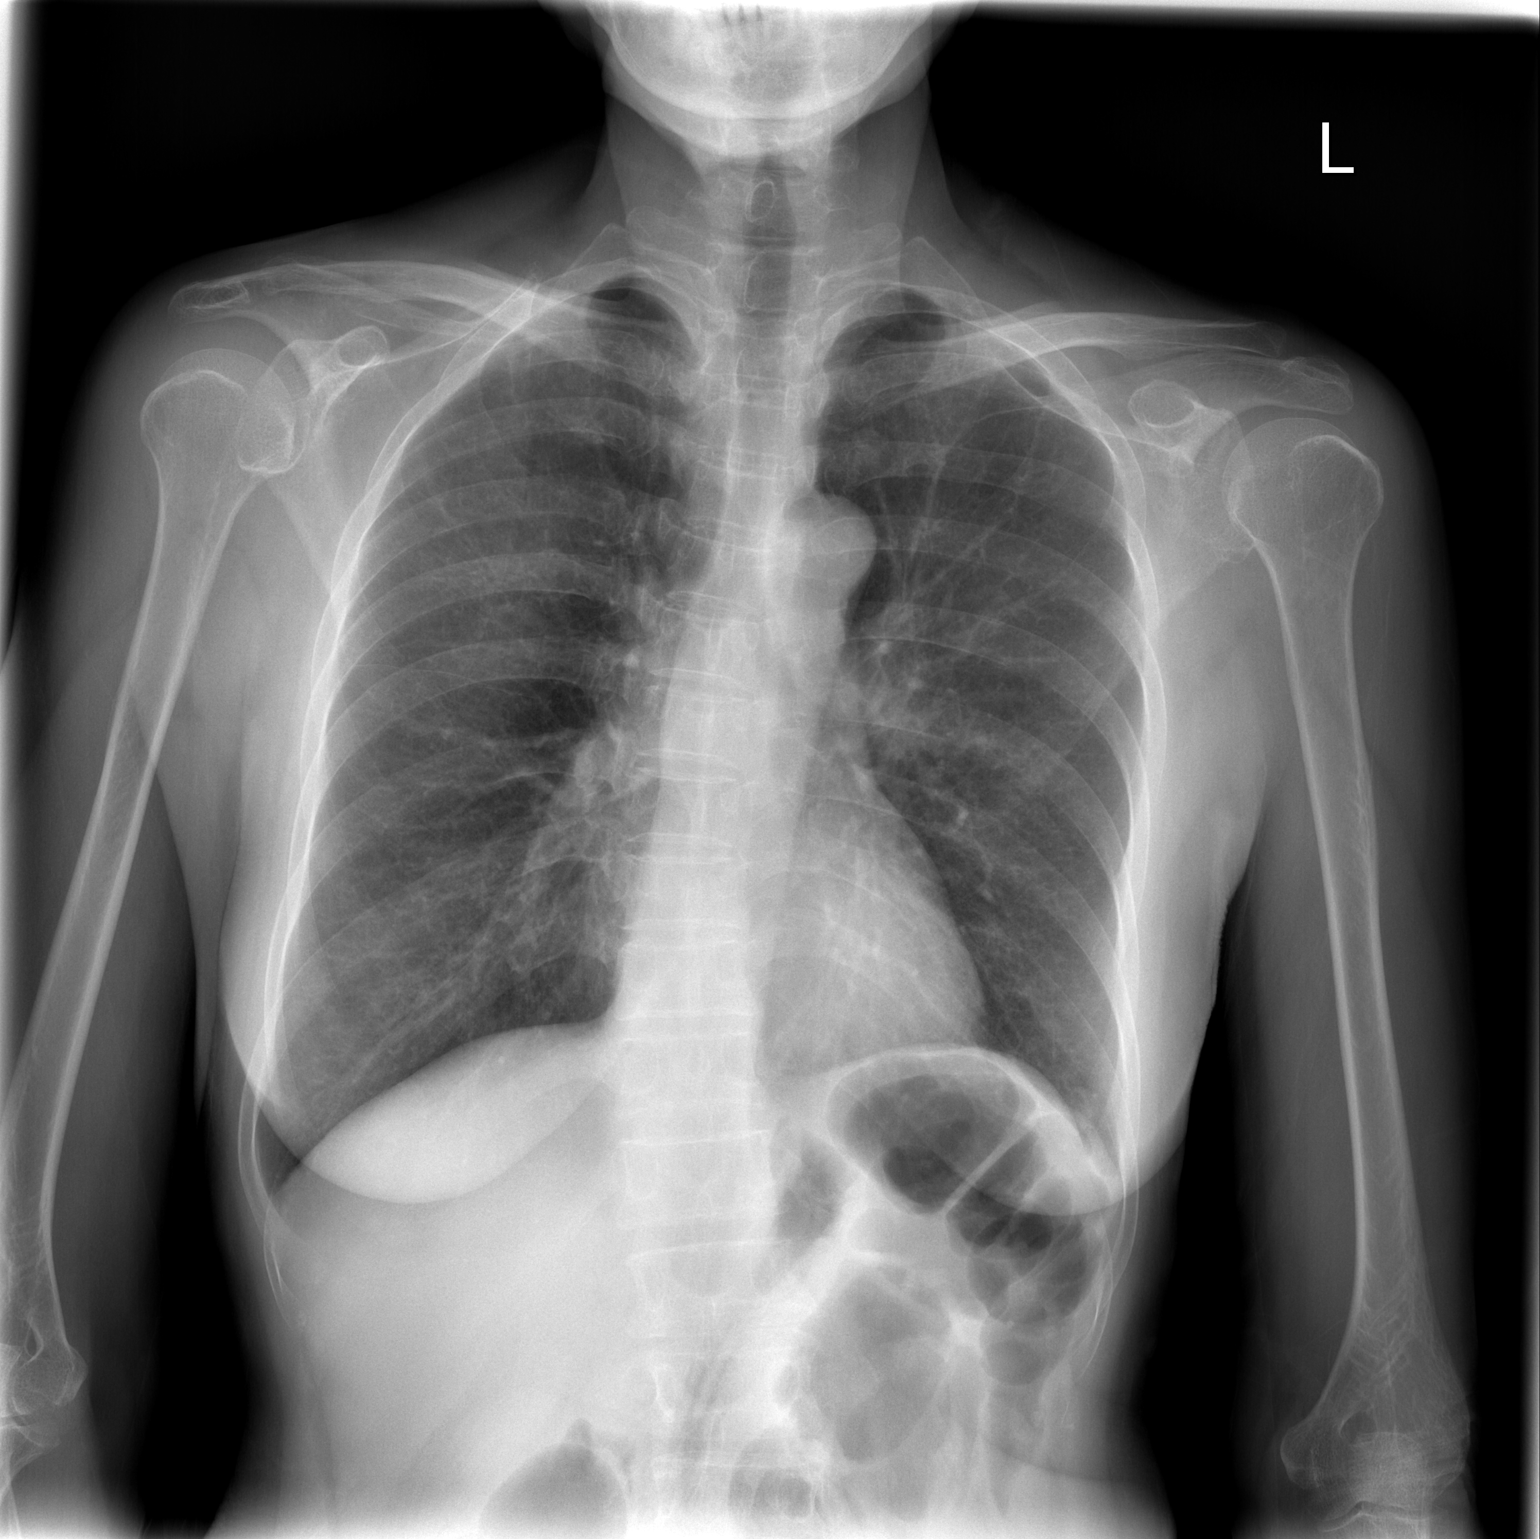

[w chest lat]
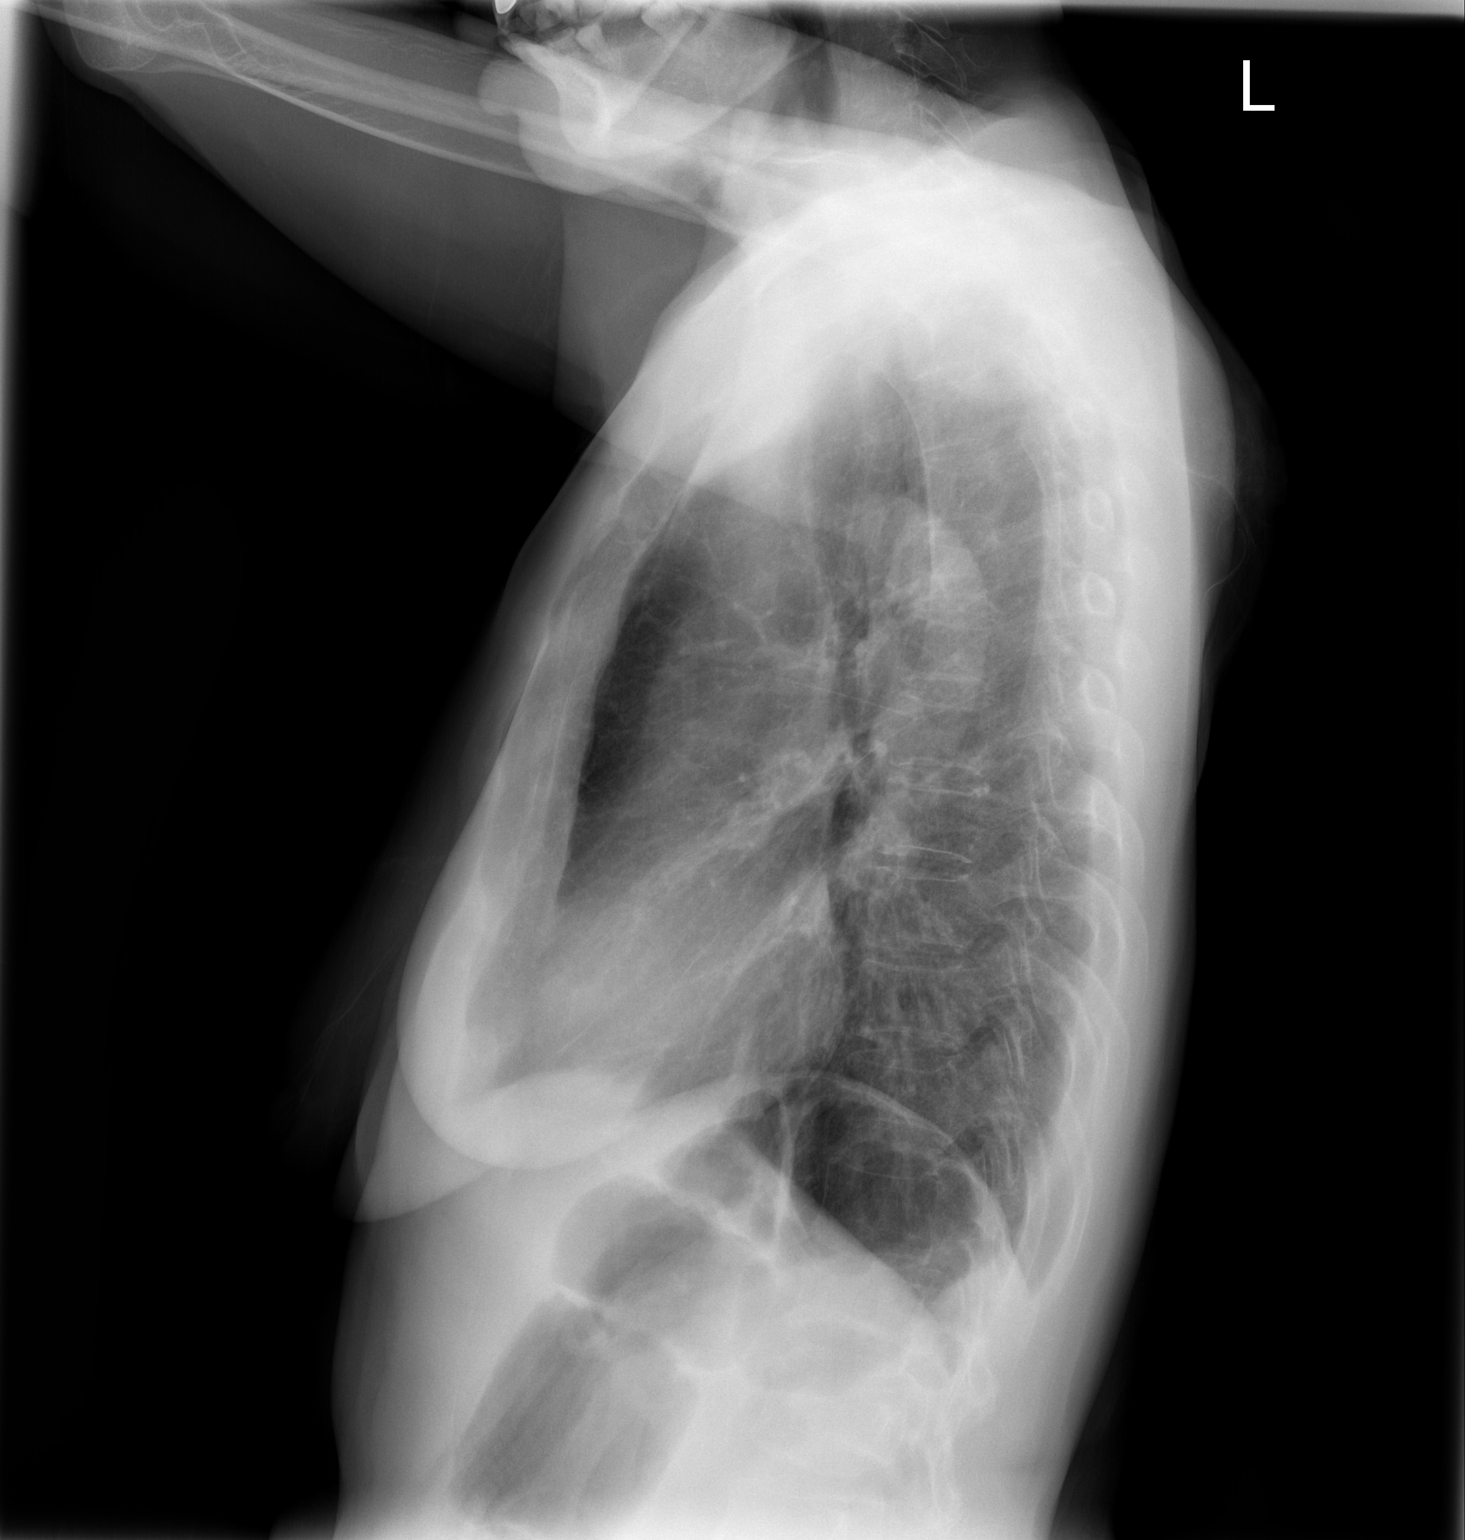

[2 of 2 positions shown; findings below may reference images not displayed]

FINDINGS: The lungs are clear without focal infiltrate, edema,
pneumothorax or pleural effusion. Interstitial markings are
diffusely coarsened with chronic features. The cardiopericardial
silhouette is within normal limits for size. Bones are diffusely
demineralized.
IMPRESSION: No acute cardiopulmonary findings.

## 2013-08-21 ENCOUNTER — Other Ambulatory Visit: Payer: Self-pay | Admitting: Gastroenterology

## 2013-08-21 DIAGNOSIS — R1032 Left lower quadrant pain: Secondary | ICD-10-CM

## 2013-08-21 DIAGNOSIS — R112 Nausea with vomiting, unspecified: Secondary | ICD-10-CM

## 2013-08-26 ENCOUNTER — Ambulatory Visit
Admission: RE | Admit: 2013-08-26 | Discharge: 2013-08-26 | Disposition: A | Discharge status: Home / Self Care | Payer: BC Managed Care – PPO | Source: Ambulatory Visit | Attending: Gastroenterology | Admitting: Gastroenterology

## 2013-08-26 DIAGNOSIS — R1032 Left lower quadrant pain: Secondary | ICD-10-CM

## 2013-08-26 DIAGNOSIS — R112 Nausea with vomiting, unspecified: Secondary | ICD-10-CM

## 2013-08-26 MED ORDER — IOHEXOL 300 MG/ML  SOLN
100.0000 mL | Freq: Once | INTRAMUSCULAR | Status: AC | PRN
  Administered 2013-08-26: 100 mL via INTRAVENOUS

## 2013-08-29 ENCOUNTER — Other Ambulatory Visit: Payer: Self-pay | Admitting: Gastroenterology

## 2013-08-30 ENCOUNTER — Encounter (HOSPITAL_COMMUNITY): Payer: Self-pay | Admitting: *Deleted

## 2013-08-30 ENCOUNTER — Ambulatory Visit (HOSPITAL_COMMUNITY)
Admission: RE | Admit: 2013-08-30 | Discharge: 2013-08-30 | Disposition: A | Discharge status: Home / Self Care | Payer: BC Managed Care – PPO | Source: Ambulatory Visit | Attending: Gastroenterology | Admitting: Gastroenterology

## 2013-08-30 ENCOUNTER — Encounter (HOSPITAL_COMMUNITY)
Admission: RE | Disposition: A | Discharge status: Home / Self Care | Payer: Self-pay | Source: Ambulatory Visit | Attending: Gastroenterology

## 2013-08-30 DIAGNOSIS — R112 Nausea with vomiting, unspecified: Secondary | ICD-10-CM | POA: Insufficient documentation

## 2013-08-30 DIAGNOSIS — K315 Obstruction of duodenum: Secondary | ICD-10-CM | POA: Insufficient documentation

## 2013-08-30 DIAGNOSIS — R109 Unspecified abdominal pain: Secondary | ICD-10-CM | POA: Insufficient documentation

## 2013-08-30 HISTORY — PX: ESOPHAGOGASTRODUODENOSCOPY: SHX5428

## 2013-08-30 SURGERY — EGD (ESOPHAGOGASTRODUODENOSCOPY)
Anesthesia: Moderate Sedation

## 2013-08-30 MED ORDER — FENTANYL CITRATE 0.05 MG/ML IJ SOLN
INTRAMUSCULAR | Status: DC | PRN
  Administered 2013-08-30: 25 ug via INTRAVENOUS
  Administered 2013-08-30 (×2): 12.5 ug via INTRAVENOUS

## 2013-08-30 MED ORDER — FENTANYL CITRATE 0.05 MG/ML IJ SOLN
INTRAMUSCULAR | Status: AC
  Filled 2013-08-30: qty 2

## 2013-08-30 MED ORDER — SODIUM CHLORIDE 0.9 % IV SOLN
INTRAVENOUS | Status: DC
  Administered 2013-08-30: 08:00:00 via INTRAVENOUS

## 2013-08-30 MED ORDER — MIDAZOLAM HCL 10 MG/2ML IJ SOLN
INTRAMUSCULAR | Status: DC | PRN
  Administered 2013-08-30: 2 mg via INTRAVENOUS
  Administered 2013-08-30 (×3): 1 mg via INTRAVENOUS

## 2013-08-30 MED ORDER — MIDAZOLAM HCL 10 MG/2ML IJ SOLN
INTRAMUSCULAR | Status: AC
Start: 2013-08-30 — End: 2013-08-30
  Filled 2013-08-30: qty 2

## 2013-08-30 MED ORDER — BUTAMBEN-TETRACAINE-BENZOCAINE 2-2-14 % EX AERO
INHALATION_SPRAY | CUTANEOUS | Status: DC | PRN
  Administered 2013-08-30: 2 via TOPICAL

## 2013-08-30 NOTE — Op Note (Signed)
[facility] [address] [place], [address]   OPERATIVE PROCEDURE REPORT  PATIENT: Katherine York, Katherine York  MR#: [id number] BIRTHDATE: November 14, 1955 , 58  yrs. old GENDER: Female ENDOSCOPIST: Jeani Hawking, MD REFERRED BY: PROCEDURE DATE: 08/30/2013 PROCEDURE:   Small bowel enteroscopy with dilation ASA CLASS:   Class III INDICATIONS:1.  Duodenal stricture. MEDICATIONS: Versed 5 mg IV and Fentanyl 75 mcg IV TOPICAL ANESTHETIC:   none  DESCRIPTION OF PROCEDURE:   After the risks benefits and alternatives of the procedure were thoroughly explained, informed consent was obtained.  The     endoscope was introduced through the mouth  and advanced to the jejunum , limited by Without limitations.   The instrument was slowly withdrawn as the mucosa was fully examined.    FINDINGS:  During this examination there was very little residual food contents.  I was able to obtain an excellent visualization. In the antrum there were two orifices.  With extensive examination it appeared that the proximal orifice was a gastrojejunostomy.  The stoma was stenosed and the pediatric colonoscope was not able to pass throug the area.  The stoma was dilated up to 15 mm with a balloon.  The efferent and affertent limbs of the jejunum were traversed.  No abnormalities were noted in this region.  The pylorus was then traversed and the distal duodenal stricutre was identified.  Interestingly, the pediatric colonoscope was able to traverse this area without difficulty, which not the case yesterday with the upper endoscope.  Regardless, this area was dilated with up to 15 mm with the dilation balloon. The expected mucosa tear was created with the dilation.   Retroflexed views revealed no abnormalities.    The scope was then withdrawn from the patient and the procedure terminated.  COMPLICATIONS: There were no complications.  ENDOSCOPIC IMPRESSION: 1) Duodenal stricture s/p dilation. 2)  Gastrojejunostomy s/p dilatation.  RECOMMENDATIONS: 1) Follow up in the office in one month. 2) Chew food well.  REPEAT EXAM:  _______________________________ eSignedJeani Hawking, MD 08/30/2013 9:10 AM   CC:  PATIENT NAME:  [name], [name] MR#: [id number]

## 2013-08-30 NOTE — Discharge Instructions (Signed)

## 2013-08-30 NOTE — H&P (Signed)
  Katherine York HPI: 58 year old female with complaints of abdominal pain, nausea, and vomiting.  She has a long history of abdominal complaints and many years ago she had some type of abdominal surgery postpartum.  She was treated in the office in the years past for constipation and she responded to the medication, but recently she states that she has a recurrence of her symptoms.  A CT scan was performed and it revealed a dilation of the C-loop of her duodenum.  Further evaluation with an EGD yesterday revealed a benign ulcerated stricture.  There was a copious amount of food and I was not able to obtain a stable and clear visualization.  Past Medical History  Diagnosis Date  . Bowel obstruction   . Bronchitis     Past Surgical History  Procedure Laterality Date  . Abdominal surgery      History reviewed. No pertinent family history.  Social History:  reports that she has never smoked. She does not have any smokeless tobacco history on file. She reports that she does not drink alcohol or use illicit drugs.  Allergies: No Known Allergies  Medications:  Scheduled:  Continuous: . sodium chloride 20 mL/hr at 08/30/13 0813    No results found for this or any previous visit (from the past 24 hour(s)).   No results found.  ROS:  As stated above in the HPI otherwise negative.  Blood pressure 115/71, pulse 70, temperature 98.2 F (36.8 C), temperature source Oral, resp. rate 17, height 5' (1.524 m), weight 0 lb (0 kg), SpO2 100.00%.    PE: Gen: NAD, Alert and Oriented HEENT:  [place]/AT, EOMI Neck: Supple, no LAD Lungs: CTA Bilaterally CV: RRR without M/G/R ABM: Soft, NTND, +BS Ext: No C/C/E  Assessment/Plan: 1) Duodenal stricture - Benign.  Plan: 1) EGD with dilation.  Theda Belfast 08/30/2013, 8:22 AM

## 2013-09-02 ENCOUNTER — Encounter (HOSPITAL_COMMUNITY): Payer: Self-pay | Admitting: Gastroenterology

## 2015-10-06 ENCOUNTER — Encounter (HOSPITAL_COMMUNITY): Payer: Self-pay | Admitting: Adult Health

## 2015-10-06 ENCOUNTER — Emergency Department (HOSPITAL_COMMUNITY)
Admission: EM | Admit: 2015-10-06 | Discharge: 2015-10-07 | Disposition: A | Discharge status: Home / Self Care | Payer: BLUE CROSS/BLUE SHIELD | Attending: Emergency Medicine | Admitting: Emergency Medicine

## 2015-10-06 DIAGNOSIS — H578 Other specified disorders of eye and adnexa: Secondary | ICD-10-CM | POA: Diagnosis not present

## 2015-10-06 DIAGNOSIS — R21 Rash and other nonspecific skin eruption: Secondary | ICD-10-CM | POA: Diagnosis present

## 2015-10-06 NOTE — ED Provider Notes (Signed)
CSN: [id number]     Arrival date & time 10/06/15  2144 History   First MD Initiated Contact with Patient 10/06/15 2253     Chief Complaint  Patient presents with  . Rash     (Consider location/radiation/quality/duration/timing/severity/associated sxs/prior Treatment) HPI   Patient is a 60 year old female with no pertinent past medical history who presents the ED with complaint of worsening rash, onset 1 month. Patient speaks Burmese and requesting her son to translate for her. Patient's son reports that approximately one month ago she had a small red itching rash to the dorsal aspect of her right forearm which they thought was due to a bug bite. He notes she was seen at an urgent care and was given medicine and cream for the itching. He reports over the past 2 weeks the rash has worsened and has spread to bilateral arms, upper chest and neck, behind both ears, right upper eyelid, back and right leg. Patient reports worsening itching to the rash and notes it has been draining clear/yellow fluid. She also reports having intermittent sharp burning pain to areas of the rash. He notes the patient was seen twice at an urgent care over the past week and was prescribed Keflex, hydroxyzine, doxepin and prednisone without relief. Denies patient taking any new medications prior to onset. Denies use of any new soaps, lotions, detergents or linens. Denies any other family members having similar rash. Denies fever, chills, headache, neck stiffness, shortness of breath, mouth/tongue swelling, wheezing, chest pain, abdominal pain, vomiting, numbness, tingling. Denies any recent travel.  Past Medical History  Diagnosis Date  . Bowel obstruction (HCC)   . Bronchitis    Past Surgical History  Procedure Laterality Date  . Abdominal surgery    . Esophagogastroduodenoscopy N/A 08/30/2013    Procedure: ESOPHAGOGASTRODUODENOSCOPY (EGD);  Surgeon: Theda BelfastPatrick D Hung, MD;  [facility];  Service: Endoscopy;   Laterality: N/A;   History reviewed. No pertinent family history. Social History  Substance Use Topics  . Smoking status: Never Smoker   . Smokeless tobacco: None  . Alcohol Use: No   OB History    No data available     Review of Systems  Skin: Positive for rash.  All other systems reviewed and are negative.     Allergies  Review of patient's allergies indicates no known allergies.  Home Medications   Prior to Admission medications   Medication Sig Start Date End Date Taking? Authorizing Provider  albuterol (PROVENTIL HFA;VENTOLIN HFA) 108 (90 BASE) MCG/ACT inhaler Inhale 1-2 puffs into the lungs every 6 (six) hours as needed for wheezing. 08/16/11 08/15/12  Lorre NickAnthony Allen, MD  predniSONE (DELTASONE) 10 MG tablet Take 2 tablets (20 mg total) by mouth daily. 08/16/11   Lorre NickAnthony Allen, MD  valACYclovir (VALTREX) 1000 MG tablet Take 1 tablet (1,000 mg total) by mouth 3 (three) times daily. 10/07/15   Satira SarkNicole Elizabeth Nadeau, PA-C   BP 95/51 mmHg  Pulse 88  Temp(Src) 98.6 F (37 C) (Oral)  Resp 15  SpO2 100% Physical Exam  Constitutional: She is oriented to person, place, and time. She appears well-developed and well-nourished. No distress.  HENT:  Head: Normocephalic and atraumatic.  Mouth/Throat: Oropharynx is clear and moist. No oropharyngeal exudate.  Eyes: Conjunctivae and EOM are normal. Pupils are equal, round, and reactive to light. Right eye exhibits no discharge. Left eye exhibits no discharge. No scleral icterus.  Mild swelling noted to right upper eyelid  Neck: Normal range of motion. Neck supple.  Cardiovascular:  Normal rate, regular rhythm, normal heart sounds and intact distal pulses.   Pulmonary/Chest: Effort normal and breath sounds normal. No respiratory distress. She has no wheezes. She has no rales. She exhibits no tenderness.  Abdominal: Soft. Bowel sounds are normal. She exhibits no distension and no mass. There is no tenderness. There is no rebound and no  guarding.  Musculoskeletal: She exhibits no edema.  Lymphadenopathy:    She has no cervical adenopathy.  Neurological: She is alert and oriented to person, place, and time.  Skin: Skin is warm and dry. Rash noted. She is not diaphoretic.  Diffuse erythematous vesicular lesions noted to BUE, superior chest, anterior neck, back, right shin, behind both ears and right upper eyelid. Extensive excoriations and honey colored crusting noted to lesions with small amount of serous drainage noted behind ears and to right upper eye lid. Lesions do not follow dermatomal pattern. No lesions noted on palms or soles. No pustules, bulla or burrows noted.  Nursing note and vitals reviewed.   ED Course  Procedures (including critical care time) Labs Review Labs Reviewed - No data to display  Imaging Review No results found. I have personally reviewed and evaluated these images and lab results as part of my medical decision-making.   EKG Interpretation None      MDM   Final diagnoses:  Rash    Patient presents with worsening itchy rash to her arms, neck, chest, back, right leg and face. Patient has been seen at an urgent care 3 times over the past month and was given Keflex, prednisone, hydroxyzine without relief. Denies fever. Denies any new medications or contact irritants. VSS. Exam revealed diffuse erythematous vesicular lesions with honey colored crusting. Rash is similar in appearance to shingles but is bilateral; it also has honey colored crusting similar to impetigo. No blisters, no pustules, no warmth, no draining sinus tracts, no superficial abscesses, no bullous impetigo, no vesicles, no desquamation, no target lesions with dusky purpura or a central bulla. Not tender to touch. No concern for superimposed infection. No concern for SJS, TEN, TSS, tick borne illness, syphilis or other life-threatening condition.Will plan to d/c pt home with mupirocen cream and valtrex and have pt follow up with  derm. Discussed results and plan for d/c with pt. Discussed strict return precautions with pt.       Satira Sarkicole Elizabeth [place] Lake[place]adeau, [place]PA-C 10/07/15 0033  Tomasita CrumbleAdeleke Oni, MD 10/07/15 [phone]

## 2015-10-06 NOTE — ED Notes (Addendum)
Presents with 4 days of vesicular-shingle like rash to left side of neck, chest, back, ear, shoulder, right eye, right breast and left arm. Her PMD placed her on a antibiotic one month ago for a bite. She c/o pain. Blisters are open and weeping.

## 2015-10-07 MED ORDER — MUPIROCIN CALCIUM 2 % EX CREA
TOPICAL_CREAM | Freq: Three times a day (TID) | CUTANEOUS | Status: DC
  Administered 2015-10-07: 01:00:00 via TOPICAL
  Filled 2015-10-07: qty 15

## 2015-10-07 MED ORDER — VALACYCLOVIR HCL 1 G PO TABS: 1000.0000 mg | ORAL_TABLET | Freq: Three times a day (TID) | ORAL | Status: AC

## 2015-10-07 NOTE — Discharge Instructions (Signed)
Take your medications as prescribed. I recommend following up with the dermatology clinic listed above in the next 2 days for recheck. Return to emergency department if symptoms worsen or new onset of fever, shortness of breath, chest pain, abdominal pain, vomiting.

## 2017-05-10 ENCOUNTER — Other Ambulatory Visit (HOSPITAL_COMMUNITY)
Admission: RE | Admit: 2017-05-10 | Discharge: 2017-05-10 | Disposition: A | Discharge status: Home / Self Care | Payer: Medicaid Other | Source: Ambulatory Visit | Attending: Family Medicine | Admitting: Family Medicine

## 2017-05-10 ENCOUNTER — Other Ambulatory Visit: Payer: Self-pay | Admitting: Family Medicine

## 2017-05-10 DIAGNOSIS — Z124 Encounter for screening for malignant neoplasm of cervix: Secondary | ICD-10-CM | POA: Insufficient documentation

## 2017-05-12 LAB — CYTOLOGY - PAP
Diagnosis: NEGATIVE
HPV (WINDOPATH): NOT DETECTED

## 2018-04-13 DIAGNOSIS — F5101 Primary insomnia: Secondary | ICD-10-CM | POA: Diagnosis not present

## 2018-04-13 DIAGNOSIS — G43109 Migraine with aura, not intractable, without status migrainosus: Secondary | ICD-10-CM | POA: Diagnosis not present

## 2018-04-13 DIAGNOSIS — F331 Major depressive disorder, recurrent, moderate: Secondary | ICD-10-CM | POA: Diagnosis not present

## 2018-04-13 DIAGNOSIS — H6502 Acute serous otitis media, left ear: Secondary | ICD-10-CM | POA: Diagnosis not present

## 2018-05-14 DIAGNOSIS — F331 Major depressive disorder, recurrent, moderate: Secondary | ICD-10-CM | POA: Diagnosis not present

## 2018-05-14 DIAGNOSIS — G43109 Migraine with aura, not intractable, without status migrainosus: Secondary | ICD-10-CM | POA: Diagnosis not present

## 2018-05-14 DIAGNOSIS — Z0000 Encounter for general adult medical examination without abnormal findings: Secondary | ICD-10-CM | POA: Diagnosis not present

## 2018-05-14 DIAGNOSIS — Z23 Encounter for immunization: Secondary | ICD-10-CM | POA: Diagnosis not present

## 2018-05-14 DIAGNOSIS — F5101 Primary insomnia: Secondary | ICD-10-CM | POA: Diagnosis not present

## 2018-05-16 ENCOUNTER — Other Ambulatory Visit: Payer: Self-pay | Admitting: Family Medicine

## 2018-05-16 DIAGNOSIS — Z1231 Encounter for screening mammogram for malignant neoplasm of breast: Secondary | ICD-10-CM

## 2018-05-16 DIAGNOSIS — E2839 Other primary ovarian failure: Secondary | ICD-10-CM

## 2018-06-05 DIAGNOSIS — R413 Other amnesia: Secondary | ICD-10-CM | POA: Diagnosis not present

## 2018-06-05 DIAGNOSIS — F331 Major depressive disorder, recurrent, moderate: Secondary | ICD-10-CM | POA: Diagnosis not present

## 2018-06-05 DIAGNOSIS — F5101 Primary insomnia: Secondary | ICD-10-CM | POA: Diagnosis not present

## 2018-06-05 DIAGNOSIS — G43109 Migraine with aura, not intractable, without status migrainosus: Secondary | ICD-10-CM | POA: Diagnosis not present

## 2019-02-05 ENCOUNTER — Ambulatory Visit: Payer: Self-pay

## 2019-02-05 ENCOUNTER — Other Ambulatory Visit: Payer: Self-pay

## 2019-08-04 ENCOUNTER — Ambulatory Visit: Payer: Medicare Other | Attending: Internal Medicine

## 2019-08-04 DIAGNOSIS — Z23 Encounter for immunization: Secondary | ICD-10-CM

## 2019-08-04 NOTE — Progress Notes (Signed)
   Covid-19 Vaccination Clinic  Name:  Katherine York    MRN: [id number] DOB: 11/10/1955  08/04/2019  [name] was observed post Covid-19 immunization for 15 minutes without incident. She was provided with Vaccine Information Sheet and instruction to access the V-Safe system.   [name] was instructed to call 911 with any severe reactions post vaccine: Marland Kitchen Difficulty breathing  . Swelling of face and throat  . A fast heartbeat  . A bad rash all over body  . Dizziness and weakness   Immunizations Administered    Name Date Dose VIS Date Route   Moderna COVID-19 Vaccine 08/04/2019  1:04 PM 0.5 mL 03/2019 Intramuscular   Manufacturer: Moderna   LotMadilyn Hook   NDC: 37048-889-16

## 2019-08-07 DIAGNOSIS — R1084 Generalized abdominal pain: Secondary | ICD-10-CM | POA: Diagnosis not present

## 2019-08-07 DIAGNOSIS — K5900 Constipation, unspecified: Secondary | ICD-10-CM | POA: Diagnosis not present

## 2019-09-01 ENCOUNTER — Ambulatory Visit: Payer: Medicare Other | Attending: Internal Medicine

## 2019-09-01 DIAGNOSIS — Z23 Encounter for immunization: Secondary | ICD-10-CM

## 2019-09-01 NOTE — Progress Notes (Signed)
   Covid-19 Vaccination Clinic  Name:  Katherine York    MRN: [id number] DOB: 1956/03/16  09/01/2019  Ms. Deskins was observed post Covid-19 immunization for 15 minutes without incident. She was provided with Vaccine Information Sheet and instruction to access the V-Safe system.   Ms. Closser was instructed to call 911 with any severe reactions post vaccine: Marland Kitchen Difficulty breathing  . Swelling of face and throat  . A fast heartbeat  . A bad rash all over body  . Dizziness and weakness   Immunizations Administered    Name Date Dose VIS Date Route   Moderna COVID-19 Vaccine 09/01/2019  1:09 PM 0.5 mL 03/2019 Intramuscular   Manufacturer: Moderna   Lot: [id number]   NDC: 21031-281-18

## 2019-10-28 ENCOUNTER — Other Ambulatory Visit: Payer: Self-pay

## 2019-10-28 ENCOUNTER — Encounter (HOSPITAL_COMMUNITY): Payer: Self-pay | Admitting: Emergency Medicine

## 2019-10-28 ENCOUNTER — Emergency Department (HOSPITAL_COMMUNITY)
Admission: EM | Admit: 2019-10-28 | Discharge: 2019-10-28 | Disposition: A | Discharge status: Home / Self Care | Payer: Medicare Other | Attending: Emergency Medicine | Admitting: Emergency Medicine

## 2019-10-28 DIAGNOSIS — R109 Unspecified abdominal pain: Secondary | ICD-10-CM | POA: Insufficient documentation

## 2019-10-28 DIAGNOSIS — Z5321 Procedure and treatment not carried out due to patient leaving prior to being seen by health care provider: Secondary | ICD-10-CM | POA: Insufficient documentation

## 2019-10-28 DIAGNOSIS — R519 Headache, unspecified: Secondary | ICD-10-CM | POA: Diagnosis not present

## 2019-10-28 LAB — CBC
HCT: 39 % (ref 36.0–46.0)
Hemoglobin: 12.1 g/dL (ref 12.0–15.0)
MCH: 29 pg (ref 26.0–34.0)
MCHC: 31 g/dL (ref 30.0–36.0)
MCV: 93.5 fL (ref 80.0–100.0)
Platelets: 364 10*3/uL (ref 150–400)
RBC: 4.17 MIL/uL (ref 3.87–5.11)
RDW: 12.2 % (ref 11.5–15.5)
WBC: 7.1 10*3/uL (ref 4.0–10.5)
nRBC: 0 % (ref 0.0–0.2)

## 2019-10-28 LAB — COMPREHENSIVE METABOLIC PANEL
ALT: 17 U/L (ref 0–44)
AST: 22 U/L (ref 15–41)
Albumin: 3.4 g/dL — ABNORMAL LOW (ref 3.5–5.0)
Alkaline Phosphatase: 80 U/L (ref 38–126)
Anion gap: 9 (ref 5–15)
BUN: 14 mg/dL (ref 8–23)
CO2: 24 mmol/L (ref 22–32)
Calcium: 8.8 mg/dL — ABNORMAL LOW (ref 8.9–10.3)
Chloride: 106 mmol/L (ref 98–111)
Creatinine, Ser: 0.57 mg/dL (ref 0.44–1.00)
GFR calc Af Amer: >60 mL/min (ref >60)
GFR calc non Af Amer: >60 mL/min (ref >60)
Glucose, Bld: 136 mg/dL — ABNORMAL HIGH (ref 70–99)
Potassium: 3.4 mmol/L — ABNORMAL LOW (ref 3.5–5.1)
Sodium: 139 mmol/L (ref 135–145)
Total Bilirubin: 1 mg/dL (ref 0.3–1.2)
Total Protein: 7.9 g/dL (ref 6.5–8.1)

## 2019-10-28 LAB — LIPASE, BLOOD: Lipase: 25 U/L (ref 11–51)

## 2019-10-28 LAB — TROPONIN I (HIGH SENSITIVITY)
Troponin I (High Sensitivity): 2 ng/L (ref <18)
Troponin I (High Sensitivity): 3 ng/L (ref <18)

## 2019-10-28 MED ORDER — SODIUM CHLORIDE 0.9% FLUSH: 3.0000 mL | Freq: Once | INTRAVENOUS | Status: DC

## 2019-10-28 MED ORDER — IBUPROFEN 400 MG PO TABS
400.0000 mg | ORAL_TABLET | Freq: Once | ORAL | Status: AC | PRN
  Administered 2019-10-28: 400 mg via ORAL
  Filled 2019-10-28: qty 1

## 2019-10-28 NOTE — ED Triage Notes (Signed)
Pt c/o abd pain and HA since last Friday, pt is AO x 4 no neuro deficit noticed. Pt denies any nausea or vomiting.

## 2019-10-28 NOTE — ED Notes (Signed)
Pt family was told the risk if she leave. Pt family said the will go to primary doctor tomorrow.

## 2019-10-29 DIAGNOSIS — R0781 Pleurodynia: Secondary | ICD-10-CM | POA: Diagnosis not present

## 2019-10-29 DIAGNOSIS — G43109 Migraine with aura, not intractable, without status migrainosus: Secondary | ICD-10-CM | POA: Diagnosis not present

## 2019-10-29 DIAGNOSIS — F3341 Major depressive disorder, recurrent, in partial remission: Secondary | ICD-10-CM | POA: Diagnosis not present

## 2019-10-29 DIAGNOSIS — R519 Headache, unspecified: Secondary | ICD-10-CM | POA: Diagnosis not present

## 2019-10-29 DIAGNOSIS — F5101 Primary insomnia: Secondary | ICD-10-CM | POA: Diagnosis not present

## 2019-10-29 DIAGNOSIS — M542 Cervicalgia: Secondary | ICD-10-CM | POA: Diagnosis not present

## 2019-11-12 ENCOUNTER — Other Ambulatory Visit: Payer: Self-pay | Admitting: Family Medicine

## 2019-11-12 DIAGNOSIS — F5101 Primary insomnia: Secondary | ICD-10-CM

## 2019-11-12 DIAGNOSIS — G43109 Migraine with aura, not intractable, without status migrainosus: Secondary | ICD-10-CM

## 2020-01-17 DIAGNOSIS — R079 Chest pain, unspecified: Secondary | ICD-10-CM | POA: Diagnosis not present

## 2020-01-17 DIAGNOSIS — R059 Cough, unspecified: Secondary | ICD-10-CM | POA: Diagnosis not present

## 2020-01-17 DIAGNOSIS — R1084 Generalized abdominal pain: Secondary | ICD-10-CM | POA: Diagnosis not present

## 2020-01-17 DIAGNOSIS — K5900 Constipation, unspecified: Secondary | ICD-10-CM | POA: Diagnosis not present

## 2020-01-17 DIAGNOSIS — Z03818 Encounter for observation for suspected exposure to other biological agents ruled out: Secondary | ICD-10-CM | POA: Diagnosis not present

## 2020-01-17 DIAGNOSIS — R0602 Shortness of breath: Secondary | ICD-10-CM | POA: Diagnosis not present

## 2020-01-28 ENCOUNTER — Emergency Department (HOSPITAL_COMMUNITY)
Admission: EM | Admit: 2020-01-28 | Discharge: 2020-01-28 | Disposition: A | Discharge status: Home / Self Care | Payer: Medicare Other | Attending: Emergency Medicine | Admitting: Emergency Medicine

## 2020-01-28 ENCOUNTER — Other Ambulatory Visit: Payer: Self-pay

## 2020-01-28 ENCOUNTER — Emergency Department (HOSPITAL_COMMUNITY): Payer: Medicare Other

## 2020-01-28 ENCOUNTER — Encounter (HOSPITAL_COMMUNITY): Payer: Self-pay | Admitting: Emergency Medicine

## 2020-01-28 DIAGNOSIS — R069 Unspecified abnormalities of breathing: Secondary | ICD-10-CM | POA: Diagnosis not present

## 2020-01-28 DIAGNOSIS — J439 Emphysema, unspecified: Secondary | ICD-10-CM | POA: Diagnosis not present

## 2020-01-28 DIAGNOSIS — F331 Major depressive disorder, recurrent, moderate: Secondary | ICD-10-CM | POA: Diagnosis not present

## 2020-01-28 DIAGNOSIS — R0602 Shortness of breath: Secondary | ICD-10-CM | POA: Insufficient documentation

## 2020-01-28 DIAGNOSIS — R6889 Other general symptoms and signs: Secondary | ICD-10-CM | POA: Diagnosis not present

## 2020-01-28 DIAGNOSIS — Z5321 Procedure and treatment not carried out due to patient leaving prior to being seen by health care provider: Secondary | ICD-10-CM | POA: Insufficient documentation

## 2020-01-28 NOTE — ED Triage Notes (Signed)
Patient is brought from home by [facility]. Patient complaining of sob since Friday. Patient was tested for covid on Friday and it was negative.

## 2020-02-19 ENCOUNTER — Other Ambulatory Visit: Payer: Self-pay | Admitting: Gastroenterology

## 2020-02-19 DIAGNOSIS — R109 Unspecified abdominal pain: Secondary | ICD-10-CM

## 2020-02-19 DIAGNOSIS — R1084 Generalized abdominal pain: Secondary | ICD-10-CM | POA: Diagnosis not present

## 2020-02-19 DIAGNOSIS — R634 Abnormal weight loss: Secondary | ICD-10-CM | POA: Diagnosis not present

## 2020-02-19 DIAGNOSIS — R6889 Other general symptoms and signs: Secondary | ICD-10-CM

## 2020-02-19 DIAGNOSIS — K5900 Constipation, unspecified: Secondary | ICD-10-CM | POA: Diagnosis not present

## 2020-03-01 DIAGNOSIS — Z23 Encounter for immunization: Secondary | ICD-10-CM | POA: Diagnosis not present

## 2020-03-02 ENCOUNTER — Other Ambulatory Visit: Payer: Self-pay

## 2020-03-02 ENCOUNTER — Encounter: Payer: Self-pay | Admitting: Pulmonary Disease

## 2020-03-02 ENCOUNTER — Ambulatory Visit (INDEPENDENT_AMBULATORY_CARE_PROVIDER_SITE_OTHER): Payer: Medicare Other | Admitting: Pulmonary Disease

## 2020-03-02 VITALS — BP 116/66 | HR 89 | Temp 96.7°F | Ht 61.0 in | Wt 87.6 lb

## 2020-03-02 DIAGNOSIS — R059 Cough, unspecified: Secondary | ICD-10-CM

## 2020-03-02 MED ORDER — FLUTICASONE FUROATE-VILANTEROL 100-25 MCG/INH IN AEPB: 1.0000 | INHALATION_SPRAY | Freq: Every day | RESPIRATORY_TRACT | 1 refills | Status: DC

## 2020-03-02 NOTE — Progress Notes (Signed)
Katherine York    [id number]    October 10, 1955  Primary Care Physician:Brake, Resa Miner, FNP  Referring Physician: Soundra Pilon, FNP [address] [place],  [place] [address]  Chief complaint:   Patient with chronic cough Weight loss recently  HPI:  Weight loss is turned around recently following resolving dental issues  Has an appointment to be followed up by GI for evaluation of abdominal pain and discomfort She did have a scope performed in 2016 that was normal  Cough usually when she is laying down or at night  Never smoker No significant exposure to secondhand smoke  She lost about 32 pounds, has managed to gain a couple that  Albuterol makes her cough In the remote past she was prescribed Symbicort-does not recollect using any other inhaler in the past  Outpatient Encounter Medications as of 03/02/2020  Medication Sig  . OVER THE COUNTER MEDICATION Take 1 tablet by mouth 2 (two) times daily.  Marland Kitchen albuterol (PROVENTIL HFA;VENTOLIN HFA) 108 (90 BASE) MCG/ACT inhaler Inhale 1-2 puffs into the lungs every 6 (six) hours as needed for wheezing.  . fluticasone furoate-vilanterol (BREO ELLIPTA) 100-25 MCG/INH AEPB Inhale 1 puff into the lungs daily.  Marland Kitchen LINZESS 290 MCG CAPS capsule   . valACYclovir (VALTREX) 1000 MG tablet Take 1 tablet (1,000 mg total) by mouth 3 (three) times daily. (Patient not taking: Reported on 03/02/2020)  . [DISCONTINUED] predniSONE (DELTASONE) 10 MG tablet Take 2 tablets (20 mg total) by mouth daily. (Patient not taking: Reported on 03/02/2020)   No facility-administered encounter medications on file as of 03/02/2020.    Allergies as of 03/02/2020  . (No Known Allergies)    Past Medical History:  Diagnosis Date  . Bowel obstruction (HCC)   . Bronchitis     Past Surgical History:  Procedure Laterality Date  . ABDOMINAL SURGERY    . ESOPHAGOGASTRODUODENOSCOPY N/A 08/30/2013   Procedure: ESOPHAGOGASTRODUODENOSCOPY (EGD);  Surgeon:  Theda Belfast, MD;  Location: [facility];  Service: Endoscopy;  Laterality: N/A;    History reviewed. No pertinent family history.  Social History   Socioeconomic History  . Marital status: Married    Spouse name: Not on file  . Number of children: Not on file  . Years of education: Not on file  . Highest education level: Not on file  Occupational History  . Not on file  Tobacco Use  . Smoking status: Never Smoker  . Smokeless tobacco: Never Used  Substance and Sexual Activity  . Alcohol use: No  . Drug use: No  . Sexual activity: Not on file  Other Topics Concern  . Not on file  Social History Narrative  . Not on file   Social Determinants of Health   Financial Resource Strain:   . Difficulty of Paying Living Expenses: Not on file  Food Insecurity:   . Worried About Programme researcher, broadcasting/film/video in the Last Year: Not on file  . Ran Out of Food in the Last Year: Not on file  Transportation Needs:   . Lack of Transportation (Medical): Not on file  . Lack of Transportation (Non-Medical): Not on file  Physical Activity:   . Days of Exercise per Week: Not on file  . Minutes of Exercise per Session: Not on file  Stress:   . Feeling of Stress : Not on file  Social Connections:   . Frequency of Communication with Friends and Family: Not on file  .  Frequency of Social Gatherings with Friends and Family: Not on file  . Attends Religious Services: Not on file  . Active Member of Clubs or Organizations: Not on file  . Attends Banker Meetings: Not on file  . Marital Status: Not on file  Intimate Partner Violence:   . Fear of Current or Ex-Partner: Not on file  . Emotionally Abused: Not on file  . Physically Abused: Not on file  . Sexually Abused: Not on file    Review of Systems  Respiratory: Positive for cough.   Gastrointestinal: Positive for abdominal pain.  All other systems reviewed and are negative.   Vitals:   03/02/20 1008  BP: 116/66  Pulse: 89   Temp: (!) 96.7 F (35.9 C)  SpO2: 99%     Physical Exam Constitutional:      Appearance: Normal appearance.  HENT:     Head: Normocephalic and atraumatic.     Mouth/Throat:     Mouth: Mucous membranes are moist.  Eyes:     General:        Right eye: No discharge.        Left eye: No discharge.  Cardiovascular:     Rate and Rhythm: Normal rate and regular rhythm.     Heart sounds: No murmur heard.  No friction rub.  Pulmonary:     Effort: No respiratory distress.     Breath sounds: No stridor. No wheezing or rhonchi.  Abdominal:     General: Abdomen is flat.     Palpations: There is no mass.     Tenderness: There is no abdominal tenderness.  Musculoskeletal:        General: No swelling.     Cervical back: No tenderness.  Neurological:     General: No focal deficit present.     Mental Status: She is alert.  Psychiatric:        Mood and Affect: Mood normal.    Data Reviewed: Recent chest x-ray about 3 days ago shows no significant abnormality, large lung volumes  Assessment:  Cough  Chest x-ray showing emphysematous changes -Never smoker, no significant secondhand smoke exposure  GI symptoms -For EGD on November 30  May possibly have reflux and reflux associated cough  Plan/Recommendations: We will give a prescription for Breo  A pulmonary function test study will be very difficult to perform and may not yield usable information  A CT scan of the chest may be considered if following GI evaluation, no significant findings results and Virgel Bouquet is not helping her cough  Follow-up in about 4 weeks  History and physical was performed with the aid of interpreter, patient's son was also present   Virl Diamond MD [facility] 03/02/2020, 10:40 AM  CC: Soundra Pilon, FNP

## 2020-03-02 NOTE — Patient Instructions (Addendum)
Cough -Recent chest x-ray did not show significant abnormality -Did show some large lung volumes -Trial with an inhaler Breo may help  Cough may be caused by reflux-material coming from your stomach to irritate your voice box -Will be important to see what the GI doctor finds an endoscopy  I will see you mid December   Call with significant concerns

## 2020-03-10 ENCOUNTER — Ambulatory Visit
Admission: RE | Admit: 2020-03-10 | Discharge: 2020-03-10 | Disposition: A | Discharge status: Home / Self Care | Payer: Medicare Other | Source: Ambulatory Visit | Attending: Gastroenterology | Admitting: Gastroenterology

## 2020-03-10 ENCOUNTER — Other Ambulatory Visit: Payer: Medicare Other

## 2020-03-10 DIAGNOSIS — R109 Unspecified abdominal pain: Secondary | ICD-10-CM | POA: Diagnosis not present

## 2020-03-10 DIAGNOSIS — R0602 Shortness of breath: Secondary | ICD-10-CM | POA: Diagnosis not present

## 2020-03-10 DIAGNOSIS — K5900 Constipation, unspecified: Secondary | ICD-10-CM | POA: Diagnosis not present

## 2020-03-10 DIAGNOSIS — K6289 Other specified diseases of anus and rectum: Secondary | ICD-10-CM | POA: Diagnosis not present

## 2020-03-10 DIAGNOSIS — R6889 Other general symptoms and signs: Secondary | ICD-10-CM

## 2020-03-10 MED ORDER — IOPAMIDOL (ISOVUE-300) INJECTION 61%
100.0000 mL | Freq: Once | INTRAVENOUS | Status: AC | PRN
  Administered 2020-03-10: 100 mL via INTRAVENOUS

## 2020-03-17 ENCOUNTER — Ambulatory Visit: Payer: Medicare Other | Admitting: Pulmonary Disease

## 2020-08-26 DIAGNOSIS — K5900 Constipation, unspecified: Secondary | ICD-10-CM | POA: Diagnosis not present

## 2020-08-26 DIAGNOSIS — R1084 Generalized abdominal pain: Secondary | ICD-10-CM | POA: Diagnosis not present

## 2020-08-26 DIAGNOSIS — R634 Abnormal weight loss: Secondary | ICD-10-CM | POA: Diagnosis not present

## 2020-10-21 ENCOUNTER — Other Ambulatory Visit: Payer: Self-pay | Admitting: Family Medicine

## 2020-10-21 ENCOUNTER — Ambulatory Visit
Admission: RE | Admit: 2020-10-21 | Discharge: 2020-10-21 | Disposition: A | Discharge status: Home / Self Care | Payer: Medicare Other | Source: Ambulatory Visit | Attending: Family Medicine | Admitting: Family Medicine

## 2020-10-21 DIAGNOSIS — M25511 Pain in right shoulder: Secondary | ICD-10-CM | POA: Diagnosis not present

## 2020-10-21 DIAGNOSIS — M545 Low back pain, unspecified: Secondary | ICD-10-CM

## 2020-10-21 DIAGNOSIS — R0781 Pleurodynia: Secondary | ICD-10-CM

## 2020-10-21 DIAGNOSIS — Z681 Body mass index (BMI) 19 or less, adult: Secondary | ICD-10-CM | POA: Diagnosis not present

## 2020-10-21 DIAGNOSIS — M419 Scoliosis, unspecified: Secondary | ICD-10-CM | POA: Diagnosis not present

## 2020-10-21 DIAGNOSIS — M5441 Lumbago with sciatica, right side: Secondary | ICD-10-CM | POA: Diagnosis not present

## 2020-12-09 DIAGNOSIS — M542 Cervicalgia: Secondary | ICD-10-CM | POA: Diagnosis not present

## 2021-04-06 ENCOUNTER — Emergency Department (HOSPITAL_COMMUNITY): Payer: Medicare Other

## 2021-04-06 ENCOUNTER — Emergency Department (HOSPITAL_COMMUNITY)
Admission: EM | Admit: 2021-04-06 | Discharge: 2021-04-06 | Disposition: A | Discharge status: Home / Self Care | Payer: Medicare Other | Attending: Emergency Medicine | Admitting: Emergency Medicine

## 2021-04-06 ENCOUNTER — Other Ambulatory Visit: Payer: Self-pay

## 2021-04-06 ENCOUNTER — Encounter (HOSPITAL_COMMUNITY): Payer: Self-pay

## 2021-04-06 DIAGNOSIS — R0602 Shortness of breath: Secondary | ICD-10-CM | POA: Insufficient documentation

## 2021-04-06 DIAGNOSIS — Z20822 Contact with and (suspected) exposure to covid-19: Secondary | ICD-10-CM | POA: Insufficient documentation

## 2021-04-06 DIAGNOSIS — R079 Chest pain, unspecified: Secondary | ICD-10-CM | POA: Diagnosis not present

## 2021-04-06 DIAGNOSIS — E876 Hypokalemia: Secondary | ICD-10-CM | POA: Diagnosis not present

## 2021-04-06 DIAGNOSIS — R7989 Other specified abnormal findings of blood chemistry: Secondary | ICD-10-CM | POA: Diagnosis not present

## 2021-04-06 DIAGNOSIS — R000 Tachycardia, unspecified: Secondary | ICD-10-CM | POA: Diagnosis not present

## 2021-04-06 DIAGNOSIS — R0689 Other abnormalities of breathing: Secondary | ICD-10-CM | POA: Diagnosis not present

## 2021-04-06 DIAGNOSIS — R0789 Other chest pain: Secondary | ICD-10-CM | POA: Insufficient documentation

## 2021-04-06 DIAGNOSIS — I3139 Other pericardial effusion (noninflammatory): Secondary | ICD-10-CM | POA: Diagnosis not present

## 2021-04-06 DIAGNOSIS — J80 Acute respiratory distress syndrome: Secondary | ICD-10-CM | POA: Diagnosis not present

## 2021-04-06 DIAGNOSIS — Z743 Need for continuous supervision: Secondary | ICD-10-CM | POA: Diagnosis not present

## 2021-04-06 LAB — CBC WITH DIFFERENTIAL/PLATELET
Abs Immature Granulocytes: 0.01 10*3/uL (ref 0.00–0.07)
Basophils Absolute: 0 10*3/uL (ref 0.0–0.1)
Basophils Relative: 0 %
Eosinophils Absolute: 0.4 10*3/uL (ref 0.0–0.5)
Eosinophils Relative: 5 %
HCT: 37.4 % (ref 36.0–46.0)
Hemoglobin: 11.8 g/dL — ABNORMAL LOW (ref 12.0–15.0)
Immature Granulocytes: 0 %
Lymphocytes Relative: 67 %
Lymphs Abs: 5.2 10*3/uL — ABNORMAL HIGH (ref 0.7–4.0)
MCH: 29.3 pg (ref 26.0–34.0)
MCHC: 31.6 g/dL (ref 30.0–36.0)
MCV: 92.8 fL (ref 80.0–100.0)
Monocytes Absolute: 0.4 10*3/uL (ref 0.1–1.0)
Monocytes Relative: 5 %
Neutro Abs: 1.8 10*3/uL (ref 1.7–7.7)
Neutrophils Relative %: 23 %
Platelets: 374 10*3/uL (ref 150–400)
RBC: 4.03 MIL/uL (ref 3.87–5.11)
RDW: 12.1 % (ref 11.5–15.5)
WBC: 7.8 10*3/uL (ref 4.0–10.5)
nRBC: 0 % (ref 0.0–0.2)

## 2021-04-06 LAB — TROPONIN I (HIGH SENSITIVITY)
Troponin I (High Sensitivity): 3 ng/L (ref <18)
Troponin I (High Sensitivity): 4 ng/L (ref <18)

## 2021-04-06 LAB — BRAIN NATRIURETIC PEPTIDE: B Natriuretic Peptide: 13.3 pg/mL (ref 0.0–100.0)

## 2021-04-06 LAB — RESP PANEL BY RT-PCR (FLU A&B, COVID) ARPGX2
Influenza A by PCR: NEGATIVE
Influenza B by PCR: NEGATIVE
SARS Coronavirus 2 by RT PCR: NEGATIVE

## 2021-04-06 LAB — BASIC METABOLIC PANEL
Anion gap: 9 (ref 5–15)
BUN: 11 mg/dL (ref 8–23)
CO2: 26 mmol/L (ref 22–32)
Calcium: 9 mg/dL (ref 8.9–10.3)
Chloride: 106 mmol/L (ref 98–111)
Creatinine, Ser: 0.69 mg/dL (ref 0.44–1.00)
GFR, Estimated: >60 mL/min (ref >60)
Glucose, Bld: 173 mg/dL — ABNORMAL HIGH (ref 70–99)
Potassium: 2.7 mmol/L — CL (ref 3.5–5.1)
Sodium: 141 mmol/L (ref 135–145)

## 2021-04-06 LAB — D-DIMER, QUANTITATIVE: D-Dimer, Quant: 0.82 ug/mL-FEU — ABNORMAL HIGH (ref 0.00–0.50)

## 2021-04-06 MED ORDER — IOHEXOL 350 MG/ML SOLN
50.0000 mL | Freq: Once | INTRAVENOUS | Status: AC | PRN
  Administered 2021-04-06: 10:00:00 50 mL via INTRAVENOUS

## 2021-04-06 MED ORDER — ALBUTEROL SULFATE HFA 108 (90 BASE) MCG/ACT IN AERS
2.0000 | INHALATION_SPRAY | Freq: Once | RESPIRATORY_TRACT | Status: AC
  Administered 2021-04-06: 15:00:00 2 via RESPIRATORY_TRACT
  Filled 2021-04-06: qty 6.7

## 2021-04-06 MED ORDER — POTASSIUM CHLORIDE 10 MEQ/100ML IV SOLN
10.0000 meq | INTRAVENOUS | Status: AC
  Administered 2021-04-06 (×2): 10 meq via INTRAVENOUS
  Filled 2021-04-06 (×4): qty 100

## 2021-04-06 MED ORDER — POTASSIUM CHLORIDE CRYS ER 20 MEQ PO TBCR
40.0000 meq | EXTENDED_RELEASE_TABLET | Freq: Once | ORAL | Status: AC
  Administered 2021-04-06: 11:00:00 40 meq via ORAL
  Filled 2021-04-06: qty 2

## 2021-04-06 NOTE — ED Triage Notes (Signed)
Pt arrived to ED via EMS from home w/ c/o shob and chest tightness, onset this morning around 0600. EMS reports pt 98% RA, pt was wheezing, no hx COPD or asthma. EMS gave 10mg  albuterol, 0.5mg  atrovent and 324mg  ASA. Nitro not given d/t language barrier. Pt's son was transported w/ pt to translate per EMS. VS: 130/90, HR 100-120, initial RR 30 and labored w/ improvement to unlabored rate 16-18.

## 2021-04-06 NOTE — ED Notes (Signed)
Pt transported to CT ?

## 2021-04-06 NOTE — ED Notes (Signed)
Pt transported to Xray at this time.

## 2021-04-06 NOTE — ED Notes (Signed)
Reviewed discharge instructions with patient and daughter. Follow-up care and medications reviewed. Patient and daughter verbalized understanding. Patient A&Ox4, VSS, and ambulatory with steady gait upon discharge.  

## 2021-04-06 NOTE — Discharge Instructions (Addendum)
Please follow up closely with your doctor for further evaluation of your condition.  Your potassium level is low today.  Eat a banana daily for 1 week and have it recheck by your doctor.  Stay hydrated.  Return to the ER if you have any other concerns.  Your COVID and Flu test are negative.  Use albuterol inhaler 2 puffs every 4 hours as needed for shortness of breath or wheezing.

## 2021-04-06 NOTE — ED Provider Notes (Signed)
[facility] EMERGENCY DEPARTMENT Provider Note   CSN: [id number] Arrival date & time: 04/06/21  0740     History No chief complaint on file.   Katherine York is a 65 y.o. female.  The history is provided by the patient, a relative, the EMS personnel and medical records. The history is limited by a language barrier. A language interpreter was used.   65 year old [place] female brought here via EMS for evaluation of shortness of breath.  History obtained through her son who is at bedside.  Patient develop acute onset of shortness of breath that started this morning.  When EMS arrived, it was noted that patient was having trouble breathing, and actively wheezing.  Patient received duo nebs of albuterol and Atrovent with improvement of her breathing.  No document history of COPD or asthma.  She is a non-smoker.  Her son mentioned that has no changes in her environment.  She lives at home with her husband and her nephew.  She was doing fine yesterday.  No prior history of PE or DVT.  History of bronchitis in the past.  Currently she denies having fever or chills no nausea vomiting or diarrhea.  She does endorse some chest discomfort.  She was noted to be tachycardic after receiving DuoNeb.  Past Medical History:  Diagnosis Date   Bowel obstruction (HCC)    Bronchitis     There are no problems to display for this patient.   Past Surgical History:  Procedure Laterality Date   ABDOMINAL SURGERY     ESOPHAGOGASTRODUODENOSCOPY N/A 08/30/2013   Procedure: ESOPHAGOGASTRODUODENOSCOPY (EGD);  Surgeon: Theda Belfast, MD;  Location: [facility];  Service: Endoscopy;  Laterality: N/A;     OB History   No obstetric history on file.     No family history on file.  Social History   Tobacco Use   Smoking status: Never   Smokeless tobacco: Never  Substance Use Topics   Alcohol use: No   Drug use: No    Home Medications Prior to Admission medications   Medication  Sig Start Date End Date Taking? Authorizing Provider  albuterol (PROVENTIL HFA;VENTOLIN HFA) 108 (90 BASE) MCG/ACT inhaler Inhale 1-2 puffs into the lungs every 6 (six) hours as needed for wheezing. 08/16/11 08/15/12  Lorre Nick, MD  fluticasone furoate-vilanterol (BREO ELLIPTA) 100-25 MCG/INH AEPB Inhale 1 puff into the lungs daily. 03/02/20   Tomma Lightning, MD  LINZESS 290 MCG CAPS capsule  02/07/20   [provider]  OVER THE COUNTER MEDICATION Take 1 tablet by mouth 2 (two) times daily.    [provider]  valACYclovir (VALTREX) 1000 MG tablet Take 1 tablet (1,000 mg total) by mouth 3 (three) times daily. Patient not taking: Reported on 03/02/2020 10/07/15   Barrett Henle, PA-C    Allergies    Patient has no known allergies.  Review of Systems   Review of Systems  All other systems reviewed and are negative.  Physical Exam Updated Vital Signs BP 107/62    Pulse 94    Temp (!) 97.5 F (36.4 C) (Oral)    Resp 16    Ht 5\' 1"  (1.549 m)    Wt 39.7 kg    SpO2 99%    BMI 16.54 kg/m   Physical Exam Vitals and nursing note reviewed.  Constitutional:      General: She is not in acute distress.    Appearance: She is well-developed.  HENT:     Head:  Normocephalic and atraumatic.  Eyes:     Conjunctiva/sclera: Conjunctivae normal.  Cardiovascular:     Rate and Rhythm: Tachycardia present.  Pulmonary:     Effort: Pulmonary effort is normal.     Breath sounds: Rales present. No wheezing or rhonchi.  Abdominal:     Palpations: Abdomen is soft.     Tenderness: There is no abdominal tenderness.  Musculoskeletal:     Cervical back: Neck supple.     Right lower leg: No edema.     Left lower leg: No edema.  Skin:    Findings: No rash.  Neurological:     Mental Status: She is alert. Mental status is at baseline.  Psychiatric:        Mood and Affect: Mood normal.    ED Results / Procedures / Treatments   Labs (all labs ordered are listed, but only  abnormal results are displayed) Labs Reviewed  BASIC METABOLIC PANEL - Abnormal; Notable for the following components:      Result Value   Potassium 2.7 (*)    Glucose, Bld 173 (*)    All other components within normal limits  CBC WITH DIFFERENTIAL/PLATELET - Abnormal; Notable for the following components:   Hemoglobin 11.8 (*)    Lymphs Abs 5.2 (*)    All other components within normal limits  D-DIMER, QUANTITATIVE - Abnormal; Notable for the following components:   D-Dimer, Quant 0.82 (*)    All other components within normal limits  RESP PANEL BY RT-PCR (FLU A&B, COVID) [id number]  BRAIN NATRIURETIC PEPTIDE  TROPONIN I (HIGH SENSITIVITY)  TROPONIN I (HIGH SENSITIVITY)    EKG None ED ECG REPORT   Date: 04/06/2021  Rate: 125  Rhythm: sinus tachycardia  QRS Axis: normal  Intervals: normal  ST/T Wave abnormalities: early repolarization  Conduction Disutrbances:none  Narrative Interpretation:   Old EKG Reviewed: none available  I have personally reviewed the EKG tracing and agree with the computerized printout as noted.   Radiology DG Chest 2 View  Result Date: 04/06/2021 CLINICAL DATA:  Shortness of breath EXAM: CHEST - 2 VIEW COMPARISON:  10/21/2020 FINDINGS: Normal heart size and mediastinal contours. No acute infiltrate or edema. No effusion or pneumothorax. No acute osseous findings. Artifact from EKG leads. Thoracic dextroscoliosis. IMPRESSION: No evidence of acute disease. Electronically Signed   By: Tiburcio Pea M.D.   On: 04/06/2021 08:26   CT Angio Chest PE W and/or Wo Contrast  Result Date: 04/06/2021 CLINICAL DATA:  Positive D-dimer, shortness of breath EXAM: CT ANGIOGRAPHY CHEST WITH CONTRAST TECHNIQUE: Multidetector CT imaging of the chest was performed using the standard protocol during bolus administration of intravenous contrast. Multiplanar CT image reconstructions and MIPs were obtained to evaluate the vascular anatomy. CONTRAST:  96mL OMNIPAQUE IOHEXOL  350 MG/ML SOLN COMPARISON:  01/01/2007 FINDINGS: Cardiovascular: Small pericardial effusion is present. Coronary artery calcifications are seen. There is homogeneous enhancement in thoracic aorta. There are no intraluminal filling defects in the pulmonary artery branches. Mediastinum/Nodes: No significant lymphadenopathy seen. There is inhomogeneous attenuation and coarse calcifications in the thyroid. Lungs/Pleura: There is no focal pulmonary consolidation. Subtle increased markings in the posterior lower lung fields may suggest passive congestive change. Linear density in anterior right mid lung fields may suggest scarring or subsegmental atelectasis. There is no pleural effusion or pneumothorax. Upper Abdomen: Unremarkable. Musculoskeletal: Unremarkable. Review of the MIP images confirms the above findings. IMPRESSION: There is no evidence of pulmonary artery embolism. There is no evidence of thoracic aortic dissection. There  is no focal pulmonary consolidation. Coronary artery calcifications are seen. Small pericardial effusion is present. Increased markings are seen in the posterior lower lung fields which may suggest passive congestive change or subsegmental atelectasis. Electronically Signed   By: Ernie Avena M.D.   On: 04/06/2021 10:10    Procedures Procedures   Medications Ordered in ED Medications  potassium chloride 10 mEq in 100 mL IVPB (0 mEq Intravenous Stopped 04/06/21 1234)  albuterol (VENTOLIN HFA) 108 (90 Base) MCG/ACT inhaler 2 puff (has no administration in time range)  potassium chloride SA (KLOR-CON M) CR tablet 40 mEq (40 mEq Oral Given 04/06/21 1109)  iohexol (OMNIPAQUE) 350 MG/ML injection 50 mL (50 mLs Intravenous Contrast Given 04/06/21 1002)    ED Course  I have reviewed the triage vital signs and the nursing notes.  Pertinent labs & imaging results that were available during my care of the patient were reviewed by me and considered in my medical decision making  (see chart for details).    MDM Rules/Calculators/A&P                         BP 110/65    Pulse 90    Temp (!) 97.5 F (36.4 C) (Oral)    Resp 15    Ht 5\' 1"  (1.549 m)    Wt 39.7 kg    SpO2 100%    BMI 16.54 kg/m      Final Clinical Impression(s) / ED Diagnoses Final diagnoses:  Shortness of breath  Hypokalemia    Rx / DC Orders ED Discharge Orders     None      7:56 AM Patient brought here for evaluation of subacute onset of shortness of breath.  EMS noted the patient was wheezing initially and did receive DuoNeb with improvement of symptoms.  Currently no appreciable wheezing noted on exam however she is tachycardic with heart rates in the 120s.  Unsure if this is secondary to recent administrations of albuterol.  However, will obtain labs including D-dimer for further assessment.  Chest x-ray ordered.  She is not hypoxic.  No report of any environmental changes or anyone else with similar symptoms at home.  Doubt carbon monoxide poisoning.  Care discussed with Dr.  10:29 AM COVID and flu test negative.  Normal BNP, normal troponin.  D-dimer is mildly elevated at 0.82, chest CT angiogram was obtained shown no evidence of PE, no evidence of aortic dissection and no focal pulmonary consolidation.  Small pericardial effusion was noted.  Increased markings of significance in the lower lung field which may suggest passive congestive changes or subsegmental segmental atelectasis.  At this time patient is resting comfortably, no hypoxia, will have patient ambulate and check O2.  She also received supplementation for hypokalemia.  12:33 PM As mentioned earlier EKG shows sinus tachycardia without any acute ischemic changes, negative delta troponin.  Suspect his symptoms not related to ACS.  When ambulating O2 sats remains above 95% on room air.  She did receive both oral and IV potassium supplementation.  COVID and flu test negative.   Donnald Garre, PA-C 04/06/21 1247     04/08/21, MD 04/06/21 [phone]

## 2021-04-06 NOTE — ED Notes (Signed)
Ambulated pt w/ pulse oximeter. Pt maintained sats around 94-100% RA.

## 2021-04-09 DIAGNOSIS — R54 Age-related physical debility: Secondary | ICD-10-CM | POA: Diagnosis not present

## 2021-04-09 DIAGNOSIS — F3341 Major depressive disorder, recurrent, in partial remission: Secondary | ICD-10-CM | POA: Diagnosis not present

## 2021-04-09 DIAGNOSIS — E871 Hypo-osmolality and hyponatremia: Secondary | ICD-10-CM | POA: Diagnosis not present

## 2021-04-09 DIAGNOSIS — I700 Atherosclerosis of aorta: Secondary | ICD-10-CM | POA: Diagnosis not present

## 2021-04-09 DIAGNOSIS — D649 Anemia, unspecified: Secondary | ICD-10-CM | POA: Diagnosis not present

## 2021-04-09 DIAGNOSIS — R5383 Other fatigue: Secondary | ICD-10-CM | POA: Diagnosis not present

## 2021-04-09 DIAGNOSIS — M545 Low back pain, unspecified: Secondary | ICD-10-CM | POA: Diagnosis not present

## 2021-04-09 DIAGNOSIS — R059 Cough, unspecified: Secondary | ICD-10-CM | POA: Diagnosis not present

## 2021-04-09 DIAGNOSIS — E46 Unspecified protein-calorie malnutrition: Secondary | ICD-10-CM | POA: Diagnosis not present

## 2021-04-15 DIAGNOSIS — R54 Age-related physical debility: Secondary | ICD-10-CM | POA: Diagnosis not present

## 2021-04-15 DIAGNOSIS — F3341 Major depressive disorder, recurrent, in partial remission: Secondary | ICD-10-CM | POA: Diagnosis not present

## 2021-04-15 DIAGNOSIS — J439 Emphysema, unspecified: Secondary | ICD-10-CM | POA: Diagnosis not present

## 2021-04-15 DIAGNOSIS — E46 Unspecified protein-calorie malnutrition: Secondary | ICD-10-CM | POA: Diagnosis not present

## 2021-04-15 DIAGNOSIS — I700 Atherosclerosis of aorta: Secondary | ICD-10-CM | POA: Diagnosis not present

## 2021-06-10 DIAGNOSIS — R55 Syncope and collapse: Secondary | ICD-10-CM | POA: Diagnosis not present

## 2021-06-10 DIAGNOSIS — R42 Dizziness and giddiness: Secondary | ICD-10-CM | POA: Diagnosis not present

## 2021-06-11 ENCOUNTER — Other Ambulatory Visit: Payer: Self-pay | Admitting: Family Medicine

## 2021-06-11 DIAGNOSIS — R42 Dizziness and giddiness: Secondary | ICD-10-CM

## 2021-06-11 DIAGNOSIS — R55 Syncope and collapse: Secondary | ICD-10-CM

## 2021-09-01 DIAGNOSIS — M545 Low back pain, unspecified: Secondary | ICD-10-CM | POA: Diagnosis not present

## 2021-09-01 DIAGNOSIS — M25551 Pain in right hip: Secondary | ICD-10-CM | POA: Diagnosis not present

## 2021-09-10 DIAGNOSIS — M5136 Other intervertebral disc degeneration, lumbar region: Secondary | ICD-10-CM | POA: Diagnosis not present

## 2021-09-29 DIAGNOSIS — R079 Chest pain, unspecified: Secondary | ICD-10-CM | POA: Diagnosis not present

## 2021-09-29 DIAGNOSIS — R519 Headache, unspecified: Secondary | ICD-10-CM | POA: Diagnosis not present

## 2021-11-08 DIAGNOSIS — R519 Headache, unspecified: Secondary | ICD-10-CM | POA: Diagnosis not present

## 2022-01-04 DIAGNOSIS — R059 Cough, unspecified: Secondary | ICD-10-CM | POA: Diagnosis not present

## 2022-01-04 DIAGNOSIS — M545 Low back pain, unspecified: Secondary | ICD-10-CM | POA: Diagnosis not present

## 2022-01-04 DIAGNOSIS — Z20822 Contact with and (suspected) exposure to covid-19: Secondary | ICD-10-CM | POA: Diagnosis not present

## 2022-05-06 DIAGNOSIS — R059 Cough, unspecified: Secondary | ICD-10-CM | POA: Diagnosis not present

## 2022-05-06 DIAGNOSIS — R0602 Shortness of breath: Secondary | ICD-10-CM | POA: Diagnosis not present

## 2022-05-16 DIAGNOSIS — Z1211 Encounter for screening for malignant neoplasm of colon: Secondary | ICD-10-CM | POA: Diagnosis not present

## 2022-05-16 DIAGNOSIS — R1084 Generalized abdominal pain: Secondary | ICD-10-CM | POA: Diagnosis not present

## 2022-05-16 DIAGNOSIS — K5900 Constipation, unspecified: Secondary | ICD-10-CM | POA: Diagnosis not present

## 2022-06-14 DIAGNOSIS — Z1211 Encounter for screening for malignant neoplasm of colon: Secondary | ICD-10-CM | POA: Diagnosis not present

## 2022-06-14 DIAGNOSIS — Z980 Intestinal bypass and anastomosis status: Secondary | ICD-10-CM | POA: Diagnosis not present

## 2022-06-16 ENCOUNTER — Emergency Department (HOSPITAL_BASED_OUTPATIENT_CLINIC_OR_DEPARTMENT_OTHER): Payer: Medicare Other | Admitting: Radiology

## 2022-06-16 ENCOUNTER — Emergency Department (HOSPITAL_BASED_OUTPATIENT_CLINIC_OR_DEPARTMENT_OTHER)
Admission: EM | Admit: 2022-06-16 | Discharge: 2022-06-16 | Disposition: A | Discharge status: Home / Self Care | Payer: Medicare Other | Attending: Emergency Medicine | Admitting: Emergency Medicine

## 2022-06-16 ENCOUNTER — Emergency Department (HOSPITAL_BASED_OUTPATIENT_CLINIC_OR_DEPARTMENT_OTHER): Payer: Medicare Other

## 2022-06-16 ENCOUNTER — Other Ambulatory Visit: Payer: Self-pay

## 2022-06-16 ENCOUNTER — Encounter (HOSPITAL_BASED_OUTPATIENT_CLINIC_OR_DEPARTMENT_OTHER): Payer: Self-pay | Admitting: Radiology

## 2022-06-16 DIAGNOSIS — R0602 Shortness of breath: Secondary | ICD-10-CM | POA: Diagnosis not present

## 2022-06-16 DIAGNOSIS — Z20822 Contact with and (suspected) exposure to covid-19: Secondary | ICD-10-CM | POA: Diagnosis not present

## 2022-06-16 DIAGNOSIS — R059 Cough, unspecified: Secondary | ICD-10-CM | POA: Diagnosis not present

## 2022-06-16 DIAGNOSIS — R0789 Other chest pain: Secondary | ICD-10-CM | POA: Diagnosis not present

## 2022-06-16 LAB — BASIC METABOLIC PANEL
Anion gap: 8 (ref 5–15)
BUN: 11 mg/dL (ref 8–23)
CO2: 25 mmol/L (ref 22–32)
Calcium: 9.1 mg/dL (ref 8.9–10.3)
Chloride: 107 mmol/L (ref 98–111)
Creatinine, Ser: 0.56 mg/dL (ref 0.44–1.00)
GFR, Estimated: >60 mL/min (ref >60)
Glucose, Bld: 104 mg/dL — ABNORMAL HIGH (ref 70–99)
Potassium: 3.7 mmol/L (ref 3.5–5.1)
Sodium: 140 mmol/L (ref 135–145)

## 2022-06-16 LAB — CBC WITH DIFFERENTIAL/PLATELET
Abs Immature Granulocytes: 0.01 10*3/uL (ref 0.00–0.07)
Basophils Absolute: 0 10*3/uL (ref 0.0–0.1)
Basophils Relative: 1 %
Eosinophils Absolute: 0.8 10*3/uL — ABNORMAL HIGH (ref 0.0–0.5)
Eosinophils Relative: 13 %
HCT: 37.2 % (ref 36.0–46.0)
Hemoglobin: 12.1 g/dL (ref 12.0–15.0)
Immature Granulocytes: 0 %
Lymphocytes Relative: 50 %
Lymphs Abs: 3.1 10*3/uL (ref 0.7–4.0)
MCH: 28.9 pg (ref 26.0–34.0)
MCHC: 32.5 g/dL (ref 30.0–36.0)
MCV: 89 fL (ref 80.0–100.0)
Monocytes Absolute: 0.5 10*3/uL (ref 0.1–1.0)
Monocytes Relative: 7 %
Neutro Abs: 1.8 10*3/uL (ref 1.7–7.7)
Neutrophils Relative %: 29 %
Platelets: 315 10*3/uL (ref 150–400)
RBC: 4.18 MIL/uL (ref 3.87–5.11)
RDW: 12.7 % (ref 11.5–15.5)
WBC: 6.3 10*3/uL (ref 4.0–10.5)
nRBC: 0 % (ref 0.0–0.2)

## 2022-06-16 LAB — BRAIN NATRIURETIC PEPTIDE: B Natriuretic Peptide: 19.7 pg/mL (ref 0.0–100.0)

## 2022-06-16 LAB — TROPONIN I (HIGH SENSITIVITY): Troponin I (High Sensitivity): 2 ng/L (ref <18)

## 2022-06-16 MED ORDER — MELATONIN 5 MG PO CAPS: 5.0000 mg | ORAL_CAPSULE | Freq: Every evening | ORAL | 0 refills | Status: AC | PRN

## 2022-06-16 MED ORDER — BENZONATATE 100 MG PO CAPS: 100.0000 mg | ORAL_CAPSULE | Freq: Three times a day (TID) | ORAL | 0 refills | Status: DC

## 2022-06-16 MED ORDER — IOHEXOL 350 MG/ML SOLN
100.0000 mL | Freq: Once | INTRAVENOUS | Status: AC | PRN
  Administered 2022-06-16: 60 mL via INTRAVENOUS

## 2022-06-16 MED ORDER — ALBUTEROL SULFATE HFA 108 (90 BASE) MCG/ACT IN AERS: 1.0000 | INHALATION_SPRAY | Freq: Four times a day (QID) | RESPIRATORY_TRACT | 0 refills | Status: DC | PRN

## 2022-06-16 NOTE — ED Provider Notes (Signed)
[facility] Provider Note   CSN: [id number] Arrival date & time: 06/16/22  1050     History  Chief Complaint  Patient presents with   Shortness of Breath    Katherine York is a 67 y.o. female.  The history is provided by the patient, medical records and a relative. No language interpreter was used.  Shortness of Breath    Patient is a 67 year-old female presenting to the ED with shortness of breath. Daughter was in the room interpreting. Daughter states she has a rescue inhaler at home she would use every 4-6 hours PRN. However, in the last month patient has been using it more frequently. Daughter has noticed her symptoms have worsen in the last two days. She has not noticed improvement using the inhaler. Patient and daughter went to her PCP this morning where she was tested for Flu, COVID, and RSV. Patient is not sure what the results were. She was advised to come to the ED for further evaluation. Patient states she feels tightness of her chest. She denies the pain radiating elsewhere. She does not endorse any abdominal pain, nausea, or vomiting.   Home Medications Prior to Admission medications   Medication Sig Start Date End Date Taking? Authorizing Provider  albuterol (PROVENTIL HFA;VENTOLIN HFA) 108 (90 BASE) MCG/ACT inhaler Inhale 1-2 puffs into the lungs every 6 (six) hours as needed for wheezing. Patient not taking: Reported on 04/06/2021 08/16/11 08/15/12  Lacretia Leigh, MD  fluticasone furoate-vilanterol (BREO ELLIPTA) 100-25 MCG/INH AEPB Inhale 1 puff into the lungs daily. Patient not taking: Reported on 04/06/2021 03/02/20   Laurin Coder, MD  LINZESS 290 MCG CAPS capsule Take 290 mcg by mouth daily before breakfast. 02/07/20   [provider]  valACYclovir (VALTREX) 1000 MG tablet Take 1 tablet (1,000 mg total) by mouth 3 (three) times daily. Patient not taking: Reported on 03/02/2020 10/07/15   Nona Dell, PA-C      Allergies    Patient has no known allergies.    Review of Systems   Review of Systems  Respiratory:  Positive for shortness of breath.   All other systems reviewed and are negative.   Physical Exam Updated Vital Signs BP (!) 142/80   Pulse 83   Temp 98 F (36.7 C) (Oral)   Resp (!) 25   Wt 35.4 kg   SpO2 94%   BMI 14.74 kg/m  Physical Exam Vitals and nursing note reviewed.  Constitutional:      General: She is not in acute distress.    Appearance: She is well-developed.     Comments: Mechele Claude elderly female appears in mild respiratory discomfort.  HENT:     Head: Atraumatic.     Mouth/Throat:     Mouth: Mucous membranes are moist.  Eyes:     Conjunctiva/sclera: Conjunctivae normal.  Neck:     Vascular: No JVD.  Cardiovascular:     Rate and Rhythm: Normal rate and regular rhythm.  Pulmonary:     Effort: Pulmonary effort is normal. Tachypnea present. No accessory muscle usage or respiratory distress.     Breath sounds: No stridor. No decreased breath sounds, wheezing, rhonchi or rales.  Chest:     Chest wall: No tenderness.  Abdominal:     Palpations: Abdomen is soft.  Musculoskeletal:     Cervical back: Neck supple.     Right lower leg: No edema.     Left lower leg: No edema.  Skin:    Capillary Refill: Capillary refill takes less than 2 seconds.     Findings: No rash.  Neurological:     Mental Status: She is alert and oriented to person, place, and time.  Psychiatric:        Mood and Affect: Mood normal.     ED Results / Procedures / Treatments   Labs (all labs ordered are listed, but only abnormal results are displayed) Labs Reviewed  BASIC METABOLIC PANEL - Abnormal; Notable for the following components:      Result Value   Glucose, Bld 104 (*)    All other components within normal limits  CBC WITH DIFFERENTIAL/PLATELET - Abnormal; Notable for the following components:   Eosinophils Absolute 0.8 (*)    All other  components within normal limits  BRAIN NATRIURETIC PEPTIDE  TROPONIN I (HIGH SENSITIVITY)  TROPONIN I (HIGH SENSITIVITY)    EKG None  Date: 06/16/2022  Rate: 87  Rhythm: normal sinus rhythm  QRS Axis: normal  Intervals: normal  ST/T Wave abnormalities: normal  Conduction Disutrbances: none  Narrative Interpretation:   Old EKG Reviewed: No significant changes noted    Radiology CT Angio Chest PE W and/or Wo Contrast  Result Date: 06/16/2022 CLINICAL DATA:  Shortness of breath, high clinical suspicion for PE EXAM: CT ANGIOGRAPHY CHEST WITH CONTRAST TECHNIQUE: Multidetector CT imaging of the chest was performed using the standard protocol during bolus administration of intravenous contrast. Multiplanar CT image reconstructions and MIPs were obtained to evaluate the vascular anatomy. RADIATION DOSE REDUCTION: This exam was performed according to the departmental dose-optimization program which includes automated exposure control, adjustment of the mA and/or kV according to patient size and/or use of iterative reconstruction technique. CONTRAST:  7m OMNIPAQUE IOHEXOL 350 MG/ML SOLN COMPARISON:  04/06/2021 FINDINGS: Cardiovascular: There is homogeneous enhancement in thoracic aorta. Ascending thoracic aorta measures 3.2 cm. There are no intraluminal filling defects in pulmonary artery branches. Coronary artery calcifications are seen. Mediastinum/Nodes: No significant lymphadenopathy is seen. Lungs/Pleura: There is no focal pulmonary consolidation. There is no pleural effusion or pneumothorax. Upper Abdomen: No acute findings are seen. Musculoskeletal: No acute findings are seen. Review of the MIP images confirms the above findings. IMPRESSION: There is no evidence of pulmonary artery embolism. There is no evidence of thoracic aortic dissection. There are scattered coronary artery calcifications. There is no focal pulmonary consolidation. There is no pleural effusion or pneumothorax. Electronically  Signed   By: PElmer PickerM.D.   On: 06/16/2022 14:20   DG Chest 2 View  Result Date: 06/16/2022 CLINICAL DATA:  Shortness of breath. EXAM: CHEST - 2 VIEW COMPARISON:  Chest x-ray 04/06/2021. FINDINGS: The heart size and mediastinal contours are within normal limits. Both lungs are clear. No visible pleural effusions or pneumothorax. No acute osseous abnormality. IMPRESSION: No active cardiopulmonary disease. Electronically Signed   By: FMargaretha SheffieldM.D.   On: 06/16/2022 11:14    Procedures Procedures    Medications Ordered in ED Medications  iohexol (OMNIPAQUE) 350 MG/ML injection 100 mL (60 mLs Intravenous Contrast Given 06/16/22 1356)    ED Course/ Medical Decision Making/ A&P                             Medical Decision Making Amount and/or Complexity of Data Reviewed Labs: ordered. Radiology: ordered.  Risk OTC drugs. Prescription drug management.   BP (!) 142/80   Pulse 83   Temp 98 F (36.7 C) (  Oral)   Resp (!) 25   Wt 35.4 kg   SpO2 94%   BMI 14.74 kg/m   42:77 AM 67 year old female remote history of bronchitis, brought here accompanied by daughter for concerns of shortness of breath.  Patient is [place] speaking, I was able to communicate with her.  She did endorse for the past month she has had progressive worsening shortness of breath however for the past 2 weeks it has become increasingly more intolerable.  Shortness of breath seem to be worse at nighttime keeping her up from sleep.  She feels as if she is unable to catch a full breath.  She is using her rescue inhaler at home and noticed that she has to use more frequently without improvement.  She does not endorse any fever or chills no runny nose sneezing coughing sore throat chest pain abdominal pain back pain.  No nausea vomiting diarrhea no lightheadedness or dizziness no hemoptysis.  No prior history of PE or DVT.  On exam this is an elderly female overall frail appearing and is in some mild  respiratory discomfort.  Occasionally she states that the right and take a deep breath.  Heart with normal rate and rhythm, lungs are clear to auscultation bilaterally without any wheezes, rales, rhonchi abdomen soft nontender she does not have any JVD, she does not have any peripheral edema.  She is moving all 4 extremities without difficulties and she has intact distal pulses.  Vital sign remarkable for mildly elevated blood pressure of 142/80.  She is afebrile, she is tachypneic with a respiratory of 25.  Her O2 sat on room air is 94%.  EMR reviewed patient has had prior diagnosis of bronchitis however her symptom is not consistent with bronchitis.  She also has had prior chest CT angiogram without evidence of PE but that was done several years prior.  Given her age and my inability to use PERC criteria to rule out PE, will obtain chest CT angiogram, additional workup initiated.  -Labs ordered, independently viewed and interpreted by me.  Labs remarkable for electrolytes panel are reassuring.  Normal WBC, normal H&H, normal BNP, normal troponin -The patient was maintained on a cardiac monitor.  I personally viewed and interpreted the cardiac monitored which showed an underlying rhythm of: NSR -Imaging independently viewed and interpreted by me and I agree with radiologist's interpretation.  Result remarkable for chest CTA without PE, PNA or dissection -This patient presents to the ED for concern of sob, this involves an extensive number of treatment options, and is a complaint that carries with it a high risk of complications and morbidity.  The differential diagnosis includes anxiety, asthma exacerbation, copd, reactive airway disease, OSA, PE, PNA, Pleurisy, anemia, ACS -Co morbidities that complicate the patient evaluation includes bronchitis -Treatment includes none -Reevaluation of the patient after these medicines showed that the patient stayed the same -PCP office notes or outside notes  reviewed -Discussion with attending Dr. Tarry Kos -Escalation to admission/observation considered: patients feels much better, is comfortable with discharge, and will follow up with PCP -Prescription medication considered, patient comfortable with albuterol, melatonin -Social Determinant of Health considered which includes language barrier  3:59 PM Patient here with ongoing shortness of breath for at least a month.  She does have history of bronchitis in the past.  She has been using her rescue inhaler but noticed no improvement.  She endorsed worsening shortness of breath at nighttime.  On exam patient appears mildly anxious but her lungs otherwise clear  no wheezes, rales, rhonchi heard.  She does not appear to be fluid overloaded.  EKG obtained independently viewed and treatment by me and overall reassuring no evidence of ischemic changes or arrhythmia.  Chest x-ray unremarkable I did obtain a chest CT angiogram to rule out PE and fortunately CT scan did not show evidence of PE, pneumonia, or dissection.  She has normal WBC normal H&H and overall workup today is reassuring.  Patient does appears anxious and having trouble sleeping will prescribe melatonin.  Otherwise encouraged patient to follow-up with pulmonologist outpatient for further assessment.         Final Clinical Impression(s) / ED Diagnoses Final diagnoses:  Shortness of breath    Rx / DC Orders ED Discharge Orders          Ordered    albuterol (VENTOLIN HFA) 108 (90 Base) MCG/ACT inhaler  Every 6 hours PRN        06/16/22 1554    Melatonin 5 MG CAPS  At bedtime PRN        06/16/22 1554              Domenic Moras, PA-C 06/16/22 1601    Elgie Congo, MD 06/18/22 1027

## 2022-06-16 NOTE — ED Triage Notes (Signed)
Pt to ED accompanied with daughter who translates at bedside c/o SHOB x days progressively getting worse. Minimal relief with inhaler, coming from PCP office. Denies chest pain.

## 2022-06-16 NOTE — Discharge Instructions (Signed)
You have been evaluated for your shortness of breath.  Fortunately no evidence concerning for heart complication.  CT scan did not show any evidence of blood clot in your lungs or signs of pneumonia.  You may continue to use your albuterol inhaler as needed for your shortness of breath.  You may take melatonin at night to help with your sleep.  However, please call and follow-up closely with a pulmonologist for outpatient evaluation of your condition.

## 2022-06-17 ENCOUNTER — Other Ambulatory Visit: Payer: Self-pay

## 2022-06-17 ENCOUNTER — Emergency Department (HOSPITAL_COMMUNITY)
Admission: EM | Admit: 2022-06-17 | Discharge: 2022-06-17 | Disposition: A | Discharge status: Home / Self Care | Payer: Medicare Other | Attending: Emergency Medicine | Admitting: Emergency Medicine

## 2022-06-17 ENCOUNTER — Emergency Department (HOSPITAL_COMMUNITY): Payer: Medicare Other

## 2022-06-17 DIAGNOSIS — Z20822 Contact with and (suspected) exposure to covid-19: Secondary | ICD-10-CM | POA: Insufficient documentation

## 2022-06-17 DIAGNOSIS — R052 Subacute cough: Secondary | ICD-10-CM | POA: Insufficient documentation

## 2022-06-17 DIAGNOSIS — R0602 Shortness of breath: Secondary | ICD-10-CM | POA: Insufficient documentation

## 2022-06-17 DIAGNOSIS — R0600 Dyspnea, unspecified: Secondary | ICD-10-CM

## 2022-06-17 DIAGNOSIS — J40 Bronchitis, not specified as acute or chronic: Secondary | ICD-10-CM

## 2022-06-17 LAB — COMPREHENSIVE METABOLIC PANEL
ALT: 18 U/L (ref 0–44)
AST: 22 U/L (ref 15–41)
Albumin: 3.6 g/dL (ref 3.5–5.0)
Alkaline Phosphatase: 60 U/L (ref 38–126)
Anion gap: 11 (ref 5–15)
BUN: 13 mg/dL (ref 8–23)
CO2: 25 mmol/L (ref 22–32)
Calcium: 9.1 mg/dL (ref 8.9–10.3)
Chloride: 103 mmol/L (ref 98–111)
Creatinine, Ser: 0.65 mg/dL (ref 0.44–1.00)
GFR, Estimated: >60 mL/min (ref >60)
Glucose, Bld: 114 mg/dL — ABNORMAL HIGH (ref 70–99)
Potassium: 3.8 mmol/L (ref 3.5–5.1)
Sodium: 139 mmol/L (ref 135–145)
Total Bilirubin: 0.5 mg/dL (ref 0.3–1.2)
Total Protein: 7.4 g/dL (ref 6.5–8.1)

## 2022-06-17 LAB — CBC WITH DIFFERENTIAL/PLATELET
Abs Immature Granulocytes: 0.01 10*3/uL (ref 0.00–0.07)
Basophils Absolute: 0 10*3/uL (ref 0.0–0.1)
Basophils Relative: 0 %
Eosinophils Absolute: 1.1 10*3/uL — ABNORMAL HIGH (ref 0.0–0.5)
Eosinophils Relative: 16 %
HCT: 39.3 % (ref 36.0–46.0)
Hemoglobin: 12.5 g/dL (ref 12.0–15.0)
Immature Granulocytes: 0 %
Lymphocytes Relative: 50 %
Lymphs Abs: 3.7 10*3/uL (ref 0.7–4.0)
MCH: 29.3 pg (ref 26.0–34.0)
MCHC: 31.8 g/dL (ref 30.0–36.0)
MCV: 92.3 fL (ref 80.0–100.0)
Monocytes Absolute: 0.6 10*3/uL (ref 0.1–1.0)
Monocytes Relative: 8 %
Neutro Abs: 1.9 10*3/uL (ref 1.7–7.7)
Neutrophils Relative %: 26 %
Platelets: 315 10*3/uL (ref 150–400)
RBC: 4.26 MIL/uL (ref 3.87–5.11)
RDW: 12.7 % (ref 11.5–15.5)
WBC: 7.3 10*3/uL (ref 4.0–10.5)
nRBC: 0 % (ref 0.0–0.2)

## 2022-06-17 LAB — TROPONIN I (HIGH SENSITIVITY)
Troponin I (High Sensitivity): 3 ng/L (ref <18)
Troponin I (High Sensitivity): 3 ng/L (ref <18)

## 2022-06-17 LAB — BRAIN NATRIURETIC PEPTIDE: B Natriuretic Peptide: 10.6 pg/mL (ref 0.0–100.0)

## 2022-06-17 LAB — RESP PANEL BY RT-PCR (RSV, FLU A&B, COVID)  RVPGX2
Influenza A by PCR: NEGATIVE
Influenza B by PCR: NEGATIVE
Resp Syncytial Virus by PCR: NEGATIVE
SARS Coronavirus 2 by RT PCR: NEGATIVE

## 2022-06-17 MED ORDER — IPRATROPIUM-ALBUTEROL 0.5-2.5 (3) MG/3ML IN SOLN
3.0000 mL | Freq: Once | RESPIRATORY_TRACT | Status: AC
  Administered 2022-06-17: 3 mL via RESPIRATORY_TRACT
  Filled 2022-06-17: qty 3

## 2022-06-17 MED ORDER — METHYLPREDNISOLONE SODIUM SUCC 125 MG IJ SOLR
60.0000 mg | Freq: Once | INTRAMUSCULAR | Status: AC
  Administered 2022-06-17: 60 mg via INTRAVENOUS
  Filled 2022-06-17: qty 2

## 2022-06-17 MED ORDER — PREDNISONE 50 MG PO TABS: ORAL_TABLET | ORAL | 0 refills | Status: DC

## 2022-06-17 MED ORDER — MAGNESIUM SULFATE 2 GM/50ML IV SOLN
2.0000 g | Freq: Once | INTRAVENOUS | Status: AC
  Administered 2022-06-17: 2 g via INTRAVENOUS
  Filled 2022-06-17: qty 50

## 2022-06-17 MED ORDER — FLUTICASONE FUROATE-VILANTEROL 100-25 MCG/ACT IN AEPB: 1.0000 | INHALATION_SPRAY | Freq: Every day | RESPIRATORY_TRACT | 0 refills | Status: DC

## 2022-06-17 NOTE — ED Notes (Signed)
AVS reviewed with pt and family prior to discharge. Family and pt verbalize understanding. Belongings with pt upon depart. Ambulatory to POV with family.

## 2022-06-17 NOTE — ED Provider Notes (Signed)
[facility] Provider Note   CSN: [id number] Arrival date & time: 06/17/22  0543     History  Chief Complaint  Patient presents with   Shortness of Breath    Katherine York is a 67 y.o. female.  Level 5 caveat for language barrier.  Family at bedside is translating.  Patient with difficulty breathing over the past 2 weeks progressively worsening.  Has been using albuterol every 4 hours without relief.  She does not have any diagnosis of asthma or COPD however.  Has been diagnosed with bronchitis in the past and required albuterol intermittently but has never been on any other breathing medications.  They feel she is been progressively worsening shortness of breath for the past 2 weeks.  She was seen in the ED yesterday for similar symptoms had a reassuring workup including chest x-ray and CT angiogram of the chest which is negative for pneumonia or pulmonary embolism.  They return today with difficulty breathing which is worse when she tries to lie flat.  Nonproductive cough.  No chest pain, leg pain or leg swelling.  No runny nose or sore throat.  No travel or sick contacts.  Never was a smoker or had any smoke exposure.  They deny her ever seeing a pulmonologist. No history of CAD, CHF, VTE.  The history is provided by the patient and a relative. The history is limited by a language barrier. A language interpreter was used.  Shortness of Breath Associated symptoms: cough   Associated symptoms: no abdominal pain, no chest pain, no headaches and no vomiting        Home Medications Prior to Admission medications   Medication Sig Start Date End Date Taking? Authorizing Provider  albuterol (VENTOLIN HFA) 108 (90 Base) MCG/ACT inhaler Inhale 1-2 puffs into the lungs every 6 (six) hours as needed for wheezing. 06/16/22 04/16/33  Domenic Moras, PA-C  benzonatate (TESSALON) 100 MG capsule Take 1 capsule (100 mg total) by mouth every 8 (eight) hours.  06/16/22   Domenic Moras, PA-C  fluticasone furoate-vilanterol (BREO ELLIPTA) 100-25 MCG/INH AEPB Inhale 1 puff into the lungs daily. Patient not taking: Reported on 04/06/2021 03/02/20   Laurin Coder, MD  Melatonin 5 MG CAPS Take 1 capsule (5 mg total) by mouth at bedtime as needed (for sleep). 06/16/22   Domenic Moras, PA-C  valACYclovir (VALTREX) 1000 MG tablet Take 1 tablet (1,000 mg total) by mouth 3 (three) times daily. Patient not taking: Reported on 03/02/2020 10/07/15   Nona Dell, PA-C      Allergies    Patient has no known allergies.    Review of Systems   Review of Systems  Constitutional:  Negative for activity change, appetite change and fatigue.  HENT:  Positive for congestion.   Respiratory:  Positive for cough and shortness of breath. Negative for chest tightness.   Cardiovascular:  Negative for chest pain.  Gastrointestinal:  Negative for abdominal pain, nausea and vomiting.  Genitourinary:  Negative for dysuria and hematuria.  Musculoskeletal:  Negative for arthralgias and myalgias.  Neurological:  Negative for weakness and headaches.   all other systems are negative except as noted in the HPI and PMH.    Physical Exam Updated Vital Signs BP (!) 146/79 (BP Location: Right Arm)   Pulse 91   Temp 97.9 F (36.6 C) (Oral)   Resp 18   SpO2 96%  Physical Exam Vitals and nursing note reviewed.  Constitutional:  General: She is in acute distress.     Appearance: She is well-developed.     Comments: Tachypnea, mildly increased work of breathing  HENT:     Head: Normocephalic and atraumatic.     Mouth/Throat:     Pharynx: No oropharyngeal exudate.  Eyes:     Conjunctiva/sclera: Conjunctivae normal.     Pupils: Pupils are equal, round, and reactive to light.  Neck:     Comments: No meningismus. Cardiovascular:     Rate and Rhythm: Normal rate and regular rhythm.     Heart sounds: Normal heart sounds. No murmur heard. Pulmonary:     Effort:  Respiratory distress present.     Breath sounds: Normal breath sounds. No wheezing.     Comments: Increased effort, diminished at the bases with faint expiratory wheezing Abdominal:     Palpations: Abdomen is soft.     Tenderness: There is no abdominal tenderness. There is no guarding or rebound.  Musculoskeletal:        General: No tenderness. Normal range of motion.     Cervical back: Normal range of motion and neck supple.  Skin:    General: Skin is warm.  Neurological:     Mental Status: She is alert and oriented to person, place, and time.     Cranial Nerves: No cranial nerve deficit.     Motor: No abnormal muscle tone.     Coordination: Coordination normal.     Comments:  5/5 strength throughout. CN 2-12 intact.Equal grip strength.   Psychiatric:        Behavior: Behavior normal.     ED Results / Procedures / Treatments   Labs (all labs ordered are listed, but only abnormal results are displayed) Labs Reviewed  CBC WITH DIFFERENTIAL/PLATELET - Abnormal; Notable for the following components:      Result Value   Eosinophils Absolute 1.1 (*)    All other components within normal limits  COMPREHENSIVE METABOLIC PANEL - Abnormal; Notable for the following components:   Glucose, Bld 114 (*)    All other components within normal limits  RESP PANEL BY RT-PCR (RSV, FLU A&B, COVID)  RVPGX2  BRAIN NATRIURETIC PEPTIDE  TROPONIN I (HIGH SENSITIVITY)    EKG EKG Interpretation  Date/Time:  Friday June 17 2022 05:54:50 EST Ventricular Rate:  89 PR Interval:  138 QRS Duration: 68 QT Interval:  374 QTC Calculation: 455 R Axis:   84 Text Interpretation: Normal sinus rhythm Normal ECG When compared with ECG of 16-Jun-2022 10:57, PREVIOUS ECG IS PRESENT No significant change was found Confirmed by Ezequiel Essex [phone]) on 06/17/2022 6:04:03 AM  Radiology DG Chest 2 View  Result Date: 06/17/2022 CLINICAL DATA:  Shortness of breath. EXAM: CHEST - 2 VIEW COMPARISON:  CTA chest  06/16/2022 at 1:59 p.m. FINDINGS: The lungs are slightly hyperexpanded but clear. The sulci are sharp. The cardiomediastinal silhouette and vasculature are normal apart from calcifications in the transverse aorta. There is osteopenia and mild-to-moderate lower thoracic dextroscoliosis. IMPRESSION: 1. No evidence of acute chest disease.  Stable hyperinflated chest. 2. Aortic atherosclerosis. 3. Osteopenia and scoliosis. Electronically Signed   By: Telford Nab M.D.   On: 06/17/2022 06:08   CT Angio Chest PE W and/or Wo Contrast  Result Date: 06/16/2022 CLINICAL DATA:  Shortness of breath, high clinical suspicion for PE EXAM: CT ANGIOGRAPHY CHEST WITH CONTRAST TECHNIQUE: Multidetector CT imaging of the chest was performed using the standard protocol during bolus administration of intravenous contrast. Multiplanar CT image reconstructions  and MIPs were obtained to evaluate the vascular anatomy. RADIATION DOSE REDUCTION: This exam was performed according to the departmental dose-optimization program which includes automated exposure control, adjustment of the mA and/or kV according to patient size and/or use of iterative reconstruction technique. CONTRAST:  16m OMNIPAQUE IOHEXOL 350 MG/ML SOLN COMPARISON:  04/06/2021 FINDINGS: Cardiovascular: There is homogeneous enhancement in thoracic aorta. Ascending thoracic aorta measures 3.2 cm. There are no intraluminal filling defects in pulmonary artery branches. Coronary artery calcifications are seen. Mediastinum/Nodes: No significant lymphadenopathy is seen. Lungs/Pleura: There is no focal pulmonary consolidation. There is no pleural effusion or pneumothorax. Upper Abdomen: No acute findings are seen. Musculoskeletal: No acute findings are seen. Review of the MIP images confirms the above findings. IMPRESSION: There is no evidence of pulmonary artery embolism. There is no evidence of thoracic aortic dissection. There are scattered coronary artery calcifications. There  is no focal pulmonary consolidation. There is no pleural effusion or pneumothorax. Electronically Signed   By: PElmer PickerM.D.   On: 06/16/2022 14:20   DG Chest 2 View  Result Date: 06/16/2022 CLINICAL DATA:  Shortness of breath. EXAM: CHEST - 2 VIEW COMPARISON:  Chest x-ray 04/06/2021. FINDINGS: The heart size and mediastinal contours are within normal limits. Both lungs are clear. No visible pleural effusions or pneumothorax. No acute osseous abnormality. IMPRESSION: No active cardiopulmonary disease. Electronically Signed   By: FMargaretha SheffieldM.D.   On: 06/16/2022 11:14    Procedures Procedures    Medications Ordered in ED Medications  ipratropium-albuterol (DUONEB) 0.5-2.5 (3) MG/3ML nebulizer solution 3 mL (has no administration in time range)  methylPREDNISolone sodium succinate (SOLU-MEDROL) 125 mg/2 mL injection 60 mg (has no administration in time range)  magnesium sulfate IVPB 2 g 50 mL (has no administration in time range)    ED Course/ Medical Decision Making/ A&P                             Medical Decision Making Amount and/or Complexity of Data Reviewed Independent Historian: guardian Labs: ordered. Decision-making details documented in ED Course. Radiology: ordered and independent interpretation performed. Decision-making details documented in ED Course. ECG/medicine tests: ordered and independent interpretation performed. Decision-making details documented in ED Course.  Risk Prescription drug management.  Acute on chronic difficulty breathing and cough over the past 2 weeks.  Dyspneic but not hypoxic.  Denies chest pain.  EKG without acute ischemia.  Chest x-ray obtained in triage and reviewed by myself is negative for pneumonia or pneumothorax or pulmonary edema.  Seen yesterday for similar symptoms and had CT angiogram of the chest which showed no pulmonary embolism or pneumonia.  Chart review shows patient was seen by pulmonology in 2021 and she  was placed on Breo but is thought her shortness of breath could be secondary to GERD.  Did not follow-up for PFTs.  Patient given bronchodilators, steroids and magnesium. Labs reassuring with no significant anemia.  Troponin negative.  BNP normal.  No evidence of volume overload.  No echocardiogram available.  Patient will be given breathing treatments and require reevaluation with ambulatory trial prior to discharge.  Will give course of steroids as well as inhaled steroids for home.  Continue albuterol.  Follow-up with pulmonology.  Care to be transferred at shift change.        Final Clinical Impression(s) / ED Diagnoses Final diagnoses:  Dyspnea, unspecified type  Subacute cough    Rx / DC Orders ED Discharge  Orders     None         Ezequiel Essex, MD 06/17/22 [phone]

## 2022-06-17 NOTE — ED Triage Notes (Signed)
Patient reports worsening SOB for several days with occasional dry cough , denies fever or chills .

## 2022-06-17 NOTE — ED Provider Notes (Signed)
  Physical Exam  BP 119/83 (BP Location: Left Arm)   Pulse 87   Temp 97.9 F (36.6 C) (Oral)   Resp 18   SpO2 95%   Physical Exam  Procedures  Procedures  ED Course / MDM   Clinical Course as of 06/17/22 0904  Fri Jun 17, 2022  3419 Assumed care from Dr Wyvonnia Dusky.  67 year old [place] speaking female with history of wheezing and inhaler use who presents with sob for 2 weeks and a dry cough. Seen yesterday and had a PE study.  Went home and had persistent shortness of breath.  On initial exam had some wheezing and plan to treat as bronchitis. Given nebs and steroids.  Labs and chest x-ray did not appear to show signs of heart failure. [RP]  0736 Walking pulse ox stayed at approx 96% nadir.  On repeat exam no lower extremity swelling.  Does have faint scattered wheezing but normal work of breathing and is satting well on room air.  Instructed the family to follow-up with her primary doctor and pulmonology and to take the steroids and inhaler as prescribed.  Return precautions discussed prior to discharge. [RP]    Clinical Course User Index [RP] Fransico Meadow, MD   Medical Decision Making Amount and/or Complexity of Data Reviewed Labs: ordered. Radiology: ordered.  Risk Prescription drug management.      Fransico Meadow, MD 06/17/22 [phone]

## 2022-06-17 NOTE — Discharge Instructions (Signed)
Take the steroids as prescribed and use your albuterol as needed every 4 hours.  Follow-up with your primary doctor as well as the pulmonologist.  You were prescribed Breo in the past which is a different type of inhaled steroid.  This will be restarted for you.  It is important you follow-up with a pulmonologist for further evaluation of your cough and difficulty breathing.  Return to the ED with new or worsening symptoms.

## 2022-06-17 NOTE — ED Notes (Signed)
Pt ambulated at this time. Pt's O2 saturation maintained at 96 to 97 percent while walking.

## 2022-06-21 ENCOUNTER — Encounter: Payer: Self-pay | Admitting: Pulmonary Disease

## 2022-06-21 ENCOUNTER — Ambulatory Visit (INDEPENDENT_AMBULATORY_CARE_PROVIDER_SITE_OTHER): Payer: Medicare Other | Admitting: Pulmonary Disease

## 2022-06-21 VITALS — BP 116/64 | HR 75 | Ht 64.0 in | Wt 81.0 lb

## 2022-06-21 DIAGNOSIS — R059 Cough, unspecified: Secondary | ICD-10-CM | POA: Diagnosis not present

## 2022-06-21 MED ORDER — FLUTICASONE FUROATE-VILANTEROL 100-25 MCG/ACT IN AEPB: 1.0000 | INHALATION_SPRAY | Freq: Every day | RESPIRATORY_TRACT | 11 refills | Status: DC

## 2022-06-21 MED ORDER — ALBUTEROL SULFATE HFA 108 (90 BASE) MCG/ACT IN AERS: 2.0000 | INHALATION_SPRAY | Freq: Four times a day (QID) | RESPIRATORY_TRACT | 0 refills | Status: AC | PRN

## 2022-06-21 MED ORDER — IPRATROPIUM-ALBUTEROL 0.5-2.5 (3) MG/3ML IN SOLN: 3.0000 mL | Freq: Four times a day (QID) | RESPIRATORY_TRACT | 3 refills | Status: AC | PRN

## 2022-06-21 NOTE — Patient Instructions (Addendum)
It is nice to meet you  I recommend we continue the Breo inhaler, the white inhaler with the blue.  I sent a refill for this.  1 puff once a day every day.  Rinse her mouth out with water after every use.  If this is too expensive, please see me a message and I will look for an alternative.  I refilled the albuterol inhaler, 2 puffs every 6 hours as needed for shortness of breath or wheeze  I sent a prescription for DuoNebs, this is the nebulizer solution to be used with the mask or nebulizer machine.  This is to be used as needed for shortness of breath or wheezing.  We will send an order for a nebulizer machine and supplies, this will be delivered to your house from a [facility].  Return to clinic in 3 months or sooner as needed with Dr. Silas Flood

## 2022-06-21 NOTE — Progress Notes (Signed)
$'@Patient'U$  ID: Katherine York, female    DOB: [birth year], 67 y.o.   MRN: [id number]  Chief Complaint  Patient presents with   Hospitalization Follow-up    Pt is here for hospital follow up. Pt states cough and SOB is doing better. Pt had CXR while in hospital 06/17/22.     Referring provider: Kristen Loader, FNP  HPI:   67 y.o. woman whom we are seeing in hospital follow-up with history of asthma and cough.  Most recent pulmonary note from 2021 reviewed.  Interpreter present for duration of visit.  History largely obtained via daughter at bedside.  Previous seen for cough in clinic.  Prescribed Breo.  Does seem to improve things.  Last seen in the fall 2021.  Seems to last history of Breo, likely due to lack of follow-up.  Seem to be doing okay for a while.  Got ill last week, March 2024.  Presented to the ED x 2.  Chest x-ray 3/7 personally reviewed interpreted as hyperinflated otherwise clear.  CTA PE protocol 3/7 personally reviewed and interpreted as clear lungs.  Nothing was really done, she was sent home.  Worse in the next night.  Chest x-ray 3/8 on my review interpretation reveals clear lungs and hyperinflation.  She was given a nebulizer.  This helped symptomatically quite a bit.  Sent home on steroids.  Breo was refilled.  Since then she is doing well.  Again, notes that she was not using Breo prior to her exacerbation.    Questionaires / Pulmonary Flowsheets:   ACT:      No data to display          MMRC:     No data to display          Epworth:      No data to display          Tests:   FENO:  No results found for: "NITRICOXIDE"  PFT:     No data to display          WALK:      No data to display          Imaging: Personally reviewed and as per EMR discussion this note DG Chest 2 View  Result Date: 06/17/2022 CLINICAL DATA:  Shortness of breath. EXAM: CHEST - 2 VIEW COMPARISON:  CTA chest 06/16/2022 at 1:59 p.m. FINDINGS: The lungs are  slightly hyperexpanded but clear. The sulci are sharp. The cardiomediastinal silhouette and vasculature are normal apart from calcifications in the transverse aorta. There is osteopenia and mild-to-moderate lower thoracic dextroscoliosis. IMPRESSION: 1. No evidence of acute chest disease.  Stable hyperinflated chest. 2. Aortic atherosclerosis. 3. Osteopenia and scoliosis. Electronically Signed   By: Katherine York M.D.   On: 06/17/2022 06:08   CT Angio Chest PE W and/or Wo Contrast  Result Date: 06/16/2022 CLINICAL DATA:  Shortness of breath, high clinical suspicion for PE EXAM: CT ANGIOGRAPHY CHEST WITH CONTRAST TECHNIQUE: Multidetector CT imaging of the chest was performed using the standard protocol during bolus administration of intravenous contrast. Multiplanar CT image reconstructions and MIPs were obtained to evaluate the vascular anatomy. RADIATION DOSE REDUCTION: This exam was performed according to the departmental dose-optimization program which includes automated exposure control, adjustment of the mA and/or kV according to patient size and/or use of iterative reconstruction technique. CONTRAST:  28m OMNIPAQUE IOHEXOL 350 MG/ML SOLN COMPARISON:  04/06/2021 FINDINGS: Cardiovascular: There is homogeneous enhancement in thoracic aorta. Ascending thoracic aorta measures 3.2 cm. There  are no intraluminal filling defects in pulmonary artery branches. Coronary artery calcifications are seen. Mediastinum/Nodes: No significant lymphadenopathy is seen. Lungs/Pleura: There is no focal pulmonary consolidation. There is no pleural effusion or pneumothorax. Upper Abdomen: No acute findings are seen. Musculoskeletal: No acute findings are seen. Review of the MIP images confirms the above findings. IMPRESSION: There is no evidence of pulmonary artery embolism. There is no evidence of thoracic aortic dissection. There are scattered coronary artery calcifications. There is no focal pulmonary consolidation. There is no  pleural effusion or pneumothorax. Electronically Signed   By: Katherine York M.D.   On: 06/16/2022 14:20   DG Chest 2 View  Result Date: 06/16/2022 CLINICAL DATA:  Shortness of breath. EXAM: CHEST - 2 VIEW COMPARISON:  Chest x-ray 04/06/2021. FINDINGS: The heart size and mediastinal contours are within normal limits. Both lungs are clear. No visible pleural effusions or pneumothorax. No acute osseous abnormality. IMPRESSION: No active cardiopulmonary disease. Electronically Signed   By: Katherine York M.D.   On: 06/16/2022 11:14    Lab Results: Personally Reviewed CBC    Component Value Date/Time   WBC 7.3 06/17/2022 0556   RBC 4.26 06/17/2022 0556   HGB 12.5 06/17/2022 0556   HCT 39.3 06/17/2022 0556   PLT 315 06/17/2022 0556   MCV 92.3 06/17/2022 0556   MCH 29.3 06/17/2022 0556   MCHC 31.8 06/17/2022 0556   RDW 12.7 06/17/2022 0556   LYMPHSABS 3.7 06/17/2022 0556   MONOABS 0.6 06/17/2022 0556   EOSABS 1.1 (H) 06/17/2022 0556   BASOSABS 0.0 06/17/2022 0556    BMET    Component Value Date/Time   NA 139 06/17/2022 0556   K 3.8 06/17/2022 0556   CL 103 06/17/2022 0556   CO2 25 06/17/2022 0556   GLUCOSE 114 (H) 06/17/2022 0556   BUN 13 06/17/2022 0556   CREATININE 0.65 06/17/2022 0556   CALCIUM 9.1 06/17/2022 0556   GFRNONAA >60 06/17/2022 0556   GFRAA >60 10/28/2019 1905    BNP    Component Value Date/Time   BNP 10.6 06/17/2022 0556    ProBNP No results found for: "PROBNP"  Specialty Problems   None   No Known Allergies  Immunization History  Administered Date(s) Administered   Moderna Sars-Covid-2 Vaccination 08/04/2019, 09/01/2019, 03/01/2020    Past Medical History:  Diagnosis Date   Bowel obstruction ([place])    Bronchitis     Tobacco History: Social History   Tobacco Use  Smoking Status Never  Smokeless Tobacco Never   Counseling given: Not Answered   Continue to not smoke  Outpatient Encounter Medications as of 06/21/2022   Medication Sig   benzonatate (TESSALON) 100 MG capsule Take 1 capsule (100 mg total) by mouth every 8 (eight) hours.   ipratropium-albuterol (DUONEB) 0.5-2.5 (3) MG/3ML SOLN Take 3 mLs by nebulization every 6 (six) hours as needed.   Melatonin 5 MG CAPS Take 1 capsule (5 mg total) by mouth at bedtime as needed (for sleep).   predniSONE (DELTASONE) 50 MG tablet 1 tablet PO daily   valACYclovir (VALTREX) 1000 MG tablet Take 1 tablet (1,000 mg total) by mouth 3 (three) times daily.   [DISCONTINUED] albuterol (VENTOLIN HFA) 108 (90 Base) MCG/ACT inhaler Inhale 1-2 puffs into the lungs every 6 (six) hours as needed for wheezing.   [DISCONTINUED] fluticasone furoate-vilanterol (BREO ELLIPTA) 100-25 MCG/ACT AEPB Inhale 1 puff into the lungs daily.   albuterol (VENTOLIN HFA) 108 (90 Base) MCG/ACT inhaler Inhale 2 puffs into the lungs every  6 (six) hours as needed for wheezing.   fluticasone furoate-vilanterol (BREO ELLIPTA) 100-25 MCG/ACT AEPB Inhale 1 puff into the lungs daily.   No facility-administered encounter medications on file as of 06/21/2022.     Review of Systems  Review of Systems  No chest pain with exertion.  No orthopnea or PND.  Comprehensive review of systems otherwise negative. Physical Exam  BP 116/64 (BP Location: Left Arm, Patient Position: Sitting, Cuff Size: Normal)   Pulse 75   Ht '5\' 4"'$  (1.626 m)   Wt 81 lb (36.7 kg)   SpO2 97%   BMI 13.90 kg/m   Wt Readings from Last 5 Encounters:  06/21/22 81 lb (36.7 kg)  06/16/22 78 lb (35.4 kg)  04/06/21 87 lb 8.4 oz (39.7 kg)  03/02/20 87 lb 9.6 oz (39.7 kg)  10/28/19 130 lb 1.1 oz (59 kg)    BMI Readings from Last 5 Encounters:  06/21/22 13.90 kg/m  06/16/22 12.59 kg/m  04/06/21 16.54 kg/m  03/02/20 16.55 kg/m  10/28/19 25.40 kg/m     Physical Exam General: Sitting in chair, no acute distress Eyes: EOMI, no icterus Neck: Supple no JVP Pulmonary: Clear, no work of breathing Cardiovascular: Warm, no  edema Abdomen: Nondistended, bowel sounds present MSK: No synovitis, no joint effusion Neuro: Normal gait, no weakness Psych: Normal mood, full affect   Assessment & Plan:   Moderate persistent asthma: Never smoker.  Clinical diagnosis based on prior history as well as improvement in Breo in the past.  Chest x-ray hyperinflated.  Notably, recent eosinophil count 1.1K in the ED 05/2022. lost access to [facility], not seen in clinic for nearly 3 years.  Exacerbation 05/2022 with 2 ED visits.  States was not using Breo prior to this.  Resume Breo mid dose, refill today.  1 puff each day.  Albuterol refilled today.  New order for nebulizer machine and DuoNebs as well to be used as needed.  This seemed to provide pretty significant symptomatic relief when in the ED.  Chest images reviewed and clear, further increasing suspicion of exacerbation of underlying asthma.  Chronic cough: Improved in the past with Breo.  Hyperinflation on chest x-ray.  Likely related to asthma as discussed above.  Return in about 3 months (around 09/21/2022).   Lanier Clam, MD 06/21/2022   This appointment required 40 minutes of patient care (this includes precharting, chart review, review of results, face-to-face care, etc.).

## 2022-06-22 DIAGNOSIS — F3341 Major depressive disorder, recurrent, in partial remission: Secondary | ICD-10-CM | POA: Diagnosis not present

## 2022-06-22 DIAGNOSIS — R059 Cough, unspecified: Secondary | ICD-10-CM | POA: Diagnosis not present

## 2022-06-22 DIAGNOSIS — E46 Unspecified protein-calorie malnutrition: Secondary | ICD-10-CM | POA: Diagnosis not present

## 2022-06-22 DIAGNOSIS — J439 Emphysema, unspecified: Secondary | ICD-10-CM | POA: Diagnosis not present

## 2022-06-22 DIAGNOSIS — I700 Atherosclerosis of aorta: Secondary | ICD-10-CM | POA: Diagnosis not present

## 2022-07-01 ENCOUNTER — Other Ambulatory Visit: Payer: Self-pay

## 2022-07-01 ENCOUNTER — Telehealth: Payer: Self-pay | Admitting: Pulmonary Disease

## 2022-07-01 MED ORDER — FLUTICASONE FUROATE-VILANTEROL 100-25 MCG/ACT IN AEPB: 1.0000 | INHALATION_SPRAY | Freq: Every day | RESPIRATORY_TRACT | 11 refills | Status: DC

## 2022-07-01 NOTE — Telephone Encounter (Signed)
Re-sent patients breo to his pharmacy. Patients son is aware. NFN

## 2022-07-01 NOTE — Telephone Encounter (Signed)
Seyha son states pharmacy does not have the RX for [employer]. PHarmacy is [facility]. Seyha phone number is [phone].

## 2022-10-05 DIAGNOSIS — J439 Emphysema, unspecified: Secondary | ICD-10-CM | POA: Diagnosis not present

## 2022-10-05 DIAGNOSIS — Z0000 Encounter for general adult medical examination without abnormal findings: Secondary | ICD-10-CM | POA: Diagnosis not present

## 2022-10-05 DIAGNOSIS — E782 Mixed hyperlipidemia: Secondary | ICD-10-CM | POA: Diagnosis not present

## 2022-10-05 DIAGNOSIS — M79672 Pain in left foot: Secondary | ICD-10-CM | POA: Diagnosis not present

## 2022-10-05 DIAGNOSIS — E46 Unspecified protein-calorie malnutrition: Secondary | ICD-10-CM | POA: Diagnosis not present

## 2022-10-05 DIAGNOSIS — M25551 Pain in right hip: Secondary | ICD-10-CM | POA: Diagnosis not present

## 2022-10-05 DIAGNOSIS — I700 Atherosclerosis of aorta: Secondary | ICD-10-CM | POA: Diagnosis not present

## 2022-11-16 DIAGNOSIS — M25551 Pain in right hip: Secondary | ICD-10-CM | POA: Diagnosis not present

## 2023-01-21 ENCOUNTER — Emergency Department (HOSPITAL_COMMUNITY)
Admission: EM | Admit: 2023-01-21 | Discharge: 2023-01-22 | Disposition: A | Discharge status: Home / Self Care | Payer: Medicare Other | Attending: Emergency Medicine | Admitting: Emergency Medicine

## 2023-01-21 DIAGNOSIS — J4541 Moderate persistent asthma with (acute) exacerbation: Secondary | ICD-10-CM | POA: Diagnosis not present

## 2023-01-21 DIAGNOSIS — U071 COVID-19: Secondary | ICD-10-CM | POA: Diagnosis not present

## 2023-01-21 DIAGNOSIS — Z7951 Long term (current) use of inhaled steroids: Secondary | ICD-10-CM | POA: Diagnosis not present

## 2023-01-21 DIAGNOSIS — I10 Essential (primary) hypertension: Secondary | ICD-10-CM | POA: Diagnosis not present

## 2023-01-21 DIAGNOSIS — R509 Fever, unspecified: Secondary | ICD-10-CM | POA: Diagnosis not present

## 2023-01-21 DIAGNOSIS — R059 Cough, unspecified: Secondary | ICD-10-CM | POA: Diagnosis present

## 2023-01-21 MED ORDER — IPRATROPIUM-ALBUTEROL 0.5-2.5 (3) MG/3ML IN SOLN
3.0000 mL | Freq: Once | RESPIRATORY_TRACT | Status: AC
  Administered 2023-01-22: 3 mL via RESPIRATORY_TRACT
  Filled 2023-01-21: qty 3

## 2023-01-21 NOTE — ED Triage Notes (Signed)
Pt BIB EMS from home. SHOB x1 yr but worsened last 12 hrs, 2 nebs at home with no relief. Cough. EMS reports warm and clammy to the touch. A&Ox4.   EMS VS: Cap 26-29, 97% RA, HR 86, cbg 153, 150/76

## 2023-01-21 NOTE — ED Triage Notes (Signed)
EMS reports Atrovent and albuterol at pts home.

## 2023-01-22 ENCOUNTER — Emergency Department (HOSPITAL_COMMUNITY): Payer: Medicare Other

## 2023-01-22 ENCOUNTER — Other Ambulatory Visit: Payer: Self-pay

## 2023-01-22 DIAGNOSIS — U071 COVID-19: Secondary | ICD-10-CM | POA: Diagnosis not present

## 2023-01-22 DIAGNOSIS — R0602 Shortness of breath: Secondary | ICD-10-CM | POA: Diagnosis not present

## 2023-01-22 LAB — COMPREHENSIVE METABOLIC PANEL
ALT: 18 U/L (ref 0–44)
AST: 26 U/L (ref 15–41)
Albumin: 3.5 g/dL (ref 3.5–5.0)
Alkaline Phosphatase: 68 U/L (ref 38–126)
Anion gap: 14 (ref 5–15)
BUN: 13 mg/dL (ref 8–23)
CO2: 20 mmol/L — ABNORMAL LOW (ref 22–32)
Calcium: 8.9 mg/dL (ref 8.9–10.3)
Chloride: 108 mmol/L (ref 98–111)
Creatinine, Ser: 0.54 mg/dL (ref 0.44–1.00)
GFR, Estimated: >60 mL/min (ref >60)
Glucose, Bld: 167 mg/dL — ABNORMAL HIGH (ref 70–99)
Potassium: 3.9 mmol/L (ref 3.5–5.1)
Sodium: 142 mmol/L (ref 135–145)
Total Bilirubin: 0.5 mg/dL (ref 0.3–1.2)
Total Protein: 7.1 g/dL (ref 6.5–8.1)

## 2023-01-22 LAB — BRAIN NATRIURETIC PEPTIDE: B Natriuretic Peptide: 22.7 pg/mL (ref 0.0–100.0)

## 2023-01-22 LAB — CBC WITH DIFFERENTIAL/PLATELET
Abs Immature Granulocytes: 0.02 10*3/uL (ref 0.00–0.07)
Basophils Absolute: 0 10*3/uL (ref 0.0–0.1)
Basophils Relative: 0 %
Eosinophils Absolute: 0.7 10*3/uL — ABNORMAL HIGH (ref 0.0–0.5)
Eosinophils Relative: 10 %
HCT: 31.3 % — ABNORMAL LOW (ref 36.0–46.0)
Hemoglobin: 9.9 g/dL — ABNORMAL LOW (ref 12.0–15.0)
Immature Granulocytes: 0 %
Lymphocytes Relative: 25 %
Lymphs Abs: 1.7 10*3/uL (ref 0.7–4.0)
MCH: 29.2 pg (ref 26.0–34.0)
MCHC: 31.6 g/dL (ref 30.0–36.0)
MCV: 92.3 fL (ref 80.0–100.0)
Monocytes Absolute: 0.6 10*3/uL (ref 0.1–1.0)
Monocytes Relative: 9 %
Neutro Abs: 3.8 10*3/uL (ref 1.7–7.7)
Neutrophils Relative %: 56 %
Platelets: 260 10*3/uL (ref 150–400)
RBC: 3.39 MIL/uL — ABNORMAL LOW (ref 3.87–5.11)
RDW: 12.2 % (ref 11.5–15.5)
WBC: 6.7 10*3/uL (ref 4.0–10.5)
nRBC: 0 % (ref 0.0–0.2)

## 2023-01-22 LAB — TROPONIN I (HIGH SENSITIVITY)
Troponin I (High Sensitivity): 3 ng/L (ref <18)
Troponin I (High Sensitivity): 3 ng/L (ref <18)

## 2023-01-22 LAB — SARS CORONAVIRUS 2 BY RT PCR: SARS Coronavirus 2 by RT PCR: POSITIVE — AB

## 2023-01-22 LAB — D-DIMER, QUANTITATIVE: D-Dimer, Quant: 0.5 ug{FEU}/mL (ref 0.00–0.50)

## 2023-01-22 MED ORDER — PREDNISONE 20 MG PO TABS
60.0000 mg | ORAL_TABLET | Freq: Once | ORAL | Status: AC
  Administered 2023-01-22: 60 mg via ORAL
  Filled 2023-01-22: qty 3

## 2023-01-22 MED ORDER — PREDNISONE 50 MG PO TABS: ORAL_TABLET | ORAL | 0 refills | Status: DC

## 2023-01-22 NOTE — Discharge Instructions (Signed)
Take the steroids as prescribed and use your inhaler or nebulizer as needed every 4 hours.  You are COVID-positive.  Keep yourself quarantined for total of 5 days from when your symptoms first started.  Keep yourself hydrated.  Follow-up with your doctor.  Return to the ED with exertional chest pain, pain associate with shortness of breath, nausea, vomiting, sweating, other concerns.

## 2023-01-22 NOTE — ED Provider Notes (Signed)
[place] EMERGENCY DEPARTMENT AT [facility] Provider Note   CSN: [id number] Arrival date & time: 01/21/23  2340     History {Add pertinent medical, surgical, social history, OB history to HPI:1} Chief Complaint  Patient presents with   Shortness of Breath    Katherine York is a 67 y.o. female.  Level 5 caveat for language barrier.  Translator is used.  Patient brought in by EMS from home with shortness of breath worsening over the past 1 year but worse in the past 12 hours tonight.  Does have a history of apparent COPD and asthma and uses albuterol at home use to twice a day without relief.  Feels like her breathing is better.  Has a nonproductive cough.  EMS reports warm to the touch but no fever.  No pain.  No leg pain or leg swelling.  No abdominal pain, nausea or vomiting.  No fever.  No pain with urination or blood in the urine.  The history is provided by the patient and the EMS personnel. The history is limited by a language barrier. A language interpreter was used.  Shortness of Breath Associated symptoms: cough   Associated symptoms: no abdominal pain, no chest pain, no fever, no headaches, no rash and no vomiting        Home Medications Prior to Admission medications   Medication Sig Start Date End Date Taking? Authorizing Provider  albuterol (VENTOLIN HFA) 108 (90 Base) MCG/ACT inhaler Inhale 2 puffs into the lungs every 6 (six) hours as needed for wheezing. 06/21/22 04/21/33  Hunsucker, Lesia Sago, MD  benzonatate (TESSALON) 100 MG capsule Take 1 capsule (100 mg total) by mouth every 8 (eight) hours. 06/16/22   Fayrene Helper, PA-C  fluticasone furoate-vilanterol (BREO ELLIPTA) 100-25 MCG/ACT AEPB Inhale 1 puff into the lungs daily. 07/01/22   Hunsucker, Lesia Sago, MD  ipratropium-albuterol (DUONEB) 0.5-2.5 (3) MG/3ML SOLN Take 3 mLs by nebulization every 6 (six) hours as needed. 06/21/22   Hunsucker, Lesia Sago, MD  Melatonin 5 MG CAPS Take 1 capsule (5 mg total) by  mouth at bedtime as needed (for sleep). 06/16/22   Fayrene Helper, PA-C  predniSONE (DELTASONE) 50 MG tablet 1 tablet PO daily 06/17/22   Kristiana Jacko, Jeannett Senior, MD  valACYclovir (VALTREX) 1000 MG tablet Take 1 tablet (1,000 mg total) by mouth 3 (three) times daily. 10/07/15   Barrett Henle, PA-C      Allergies    Patient has no known allergies.    Review of Systems   Review of Systems  Constitutional:  Negative for activity change, appetite change and fever.  HENT:  Negative for congestion and rhinorrhea.   Respiratory:  Positive for cough and shortness of breath. Negative for chest tightness.   Cardiovascular:  Negative for chest pain and leg swelling.  Gastrointestinal:  Negative for abdominal pain, nausea and vomiting.  Genitourinary:  Negative for dysuria and hematuria.  Musculoskeletal:  Negative for arthralgias and myalgias.  Skin:  Negative for rash.  Neurological:  Negative for dizziness, weakness and headaches. Seizures: .ro.  all other systems are negative except as noted in the HPI and PMH.    Physical Exam Updated Vital Signs BP 137/83   Pulse 86   Resp (!) 23   SpO2 100%  Physical Exam Vitals and nursing note reviewed.  Constitutional:      General: She is not in acute distress.    Appearance: She is well-developed.     Comments: No increased work of breathing.  HENT:     Head: Normocephalic and atraumatic.     Mouth/Throat:     Pharynx: No oropharyngeal exudate.  Eyes:     Conjunctiva/sclera: Conjunctivae normal.     Pupils: Pupils are equal, round, and reactive to light.  Neck:     Comments: No meningismus. Cardiovascular:     Rate and Rhythm: Normal rate and regular rhythm.     Heart sounds: Normal heart sounds. No murmur heard. Pulmonary:     Effort: Pulmonary effort is normal. No respiratory distress.     Breath sounds: Normal breath sounds.     Comments: Diminished bilaterally Abdominal:     Palpations: Abdomen is soft.     Tenderness: There is no  abdominal tenderness. There is no guarding or rebound.  Musculoskeletal:        General: No tenderness. Normal range of motion.     Cervical back: Normal range of motion and neck supple.  Skin:    General: Skin is warm.  Neurological:     Mental Status: She is alert and oriented to person, place, and time.     Cranial Nerves: No cranial nerve deficit.     Motor: No abnormal muscle tone.     Coordination: Coordination normal.     Comments:  5/5 strength throughout. CN 2-12 intact.Equal grip strength.   Psychiatric:        Behavior: Behavior normal.     ED Results / Procedures / Treatments   Labs (all labs ordered are listed, but only abnormal results are displayed) Labs Reviewed  SARS CORONAVIRUS 2 BY RT PCR  CBC WITH DIFFERENTIAL/PLATELET  COMPREHENSIVE METABOLIC PANEL  BRAIN NATRIURETIC PEPTIDE  D-DIMER, QUANTITATIVE  TROPONIN I (HIGH SENSITIVITY)    EKG EKG Interpretation Date/Time:  Saturday January 21 2023 23:45:51 EDT Ventricular Rate:  86 PR Interval:  146 QRS Duration:  89 QT Interval:  394 QTC Calculation: 472 R Axis:   74  Text Interpretation: Sinus rhythm Borderline repolarization abnormality Baseline wander in lead(s) I aVR No significant change was found Confirmed by Glynn Octave [phone]) on 01/22/2023 12:00:49 AM  Radiology No results found.  Procedures Procedures  {Document cardiac monitor, telemetry assessment procedure when appropriate:1}  Medications Ordered in ED Medications  ipratropium-albuterol (DUONEB) 0.5-2.5 (3) MG/3ML nebulizer solution 3 mL (has no administration in time range)    ED Course/ Medical Decision Making/ A&P   {   Click here for ABCD2, HEART and other calculatorsREFRESH Note before signing :1}                              Medical Decision Making Amount and/or Complexity of Data Reviewed Independent Historian: EMS Labs: ordered. Decision-making details documented in ED Course. Radiology: ordered and independent  interpretation performed. Decision-making details documented in ED Course. ECG/medicine tests: ordered and independent interpretation performed. Decision-making details documented in ED Course.  Risk Prescription drug management.   Acute on chronic shortness of breath.  No hypoxia or increased work of breathing.  Clear lungs with minimal expiratory wheezing.  Give bronchodilators.  {Document critical care time when appropriate:1} {Document review of labs and clinical decision tools ie heart score, Chads2Vasc2 etc:1}  {Document your independent review of radiology images, and any outside records:1} {Document your discussion with family members, caretakers, and with consultants:1} {Document social determinants of health affecting pt's care:1} {Document your decision making why or why not admission, treatments were needed:1} Final Clinical Impression(s) / ED Diagnoses  Final diagnoses:  None    Rx / DC Orders ED Discharge Orders     None

## 2023-01-22 NOTE — ED Notes (Signed)
Oxygen saturation maintained at 95% and higher when ambulating, room air. Pt denies SHOB at this time.

## 2023-05-03 DIAGNOSIS — L2082 Flexural eczema: Secondary | ICD-10-CM | POA: Diagnosis not present

## 2023-05-16 IMAGING — CT CT ANGIO CHEST
2 of 6 series · 18 of 46 positions shown · IV contrast (APPLIED)
Comparison: 01/01/2007

CLINICAL DATA: Positive D-dimer, shortness of breath

EXAM:
CT ANGIOGRAPHY CHEST WITH CONTRAST
TECHNIQUE: Multidetector CT imaging of the chest was performed using the
standard protocol during bolus administration of intravenous
contrast. Multiplanar CT image reconstructions and MIPs were
obtained to evaluate the vascular anatomy.
CONTRAST:  50mL OMNIPAQUE IOHEXOL 350 MG/ML SOLN

[Series 7: thins · axial · 0.57mm/px · z∈[+1045,+1285]mm · 15 of 377 slices shown]
[im 17/377  lung]
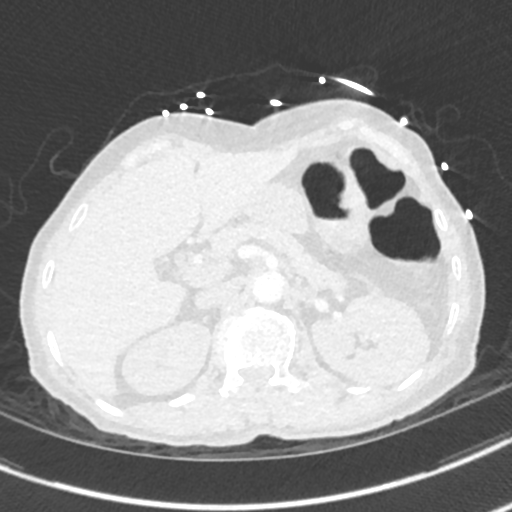
[im 50/377  soft-tissue]
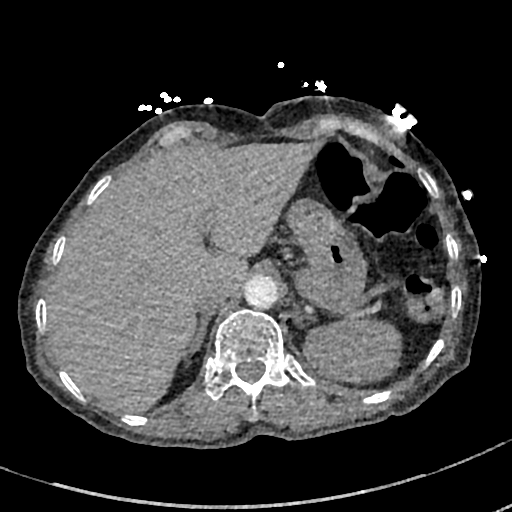
[im 66/377  lung]
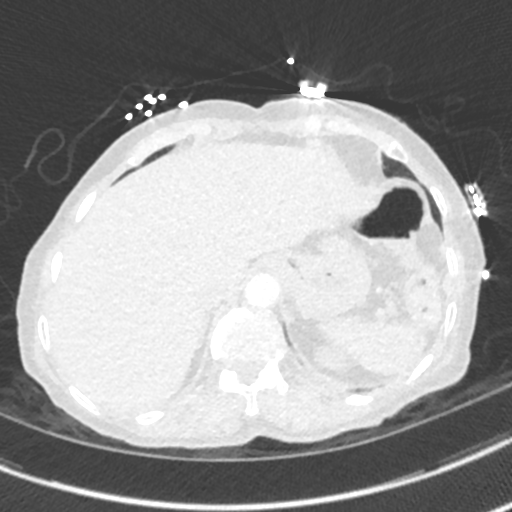
[im 99/377  soft-tissue]
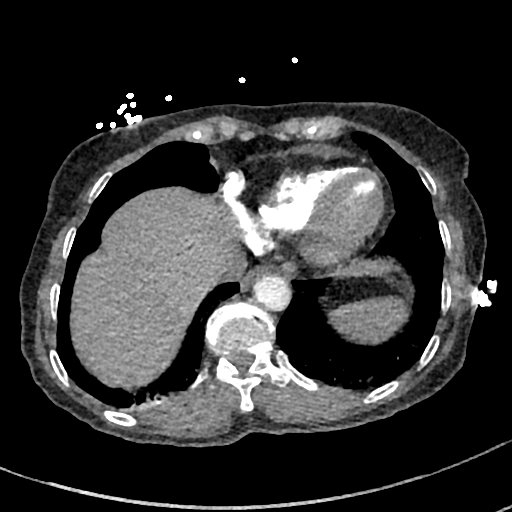
[im 115/377  lung]
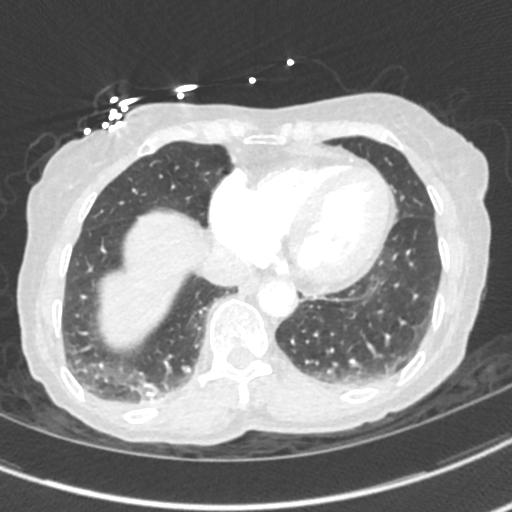
[im 148/377  soft-tissue]
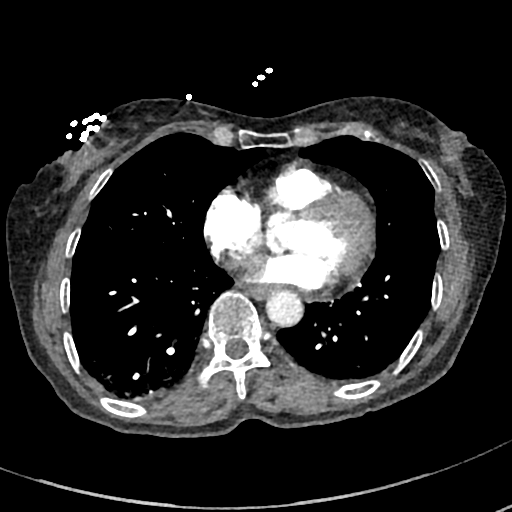
[im 164/377  lung]
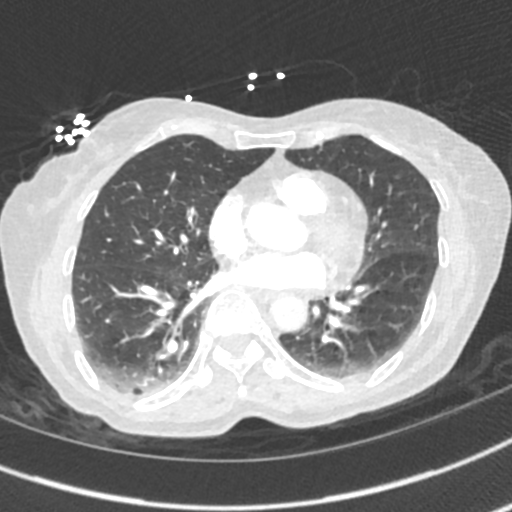
[im 197/377  soft-tissue]
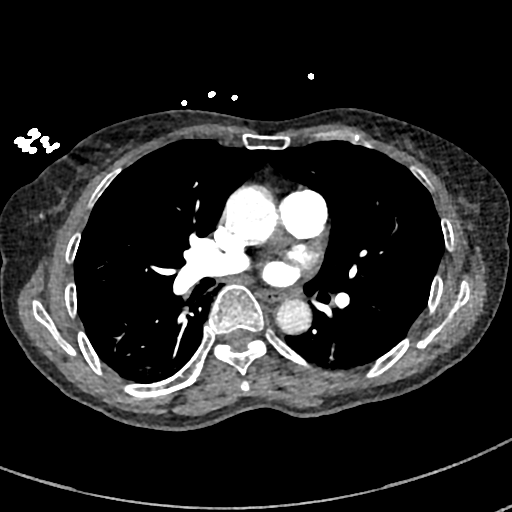
[im 213/377  lung]
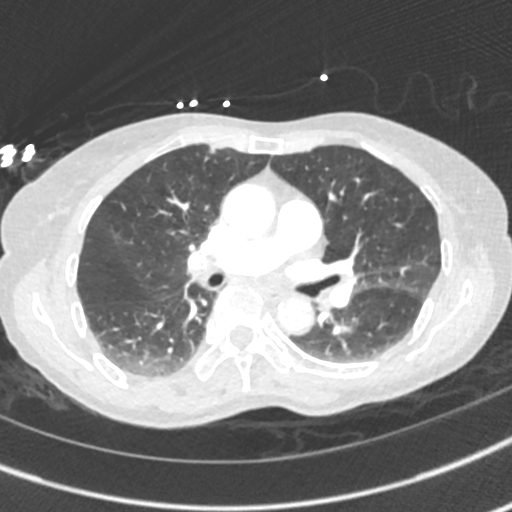
[im 229/377  soft-tissue]
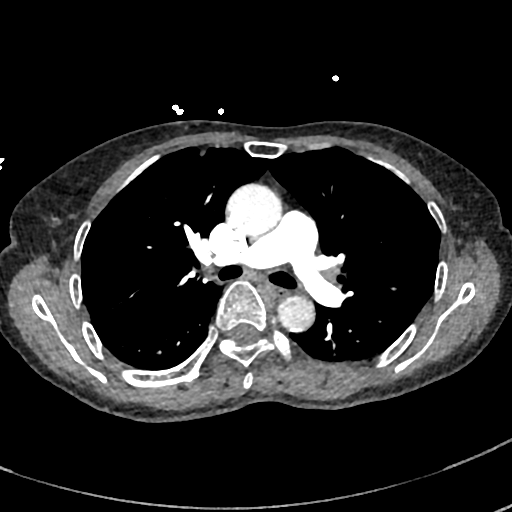
[im 262/377  lung]
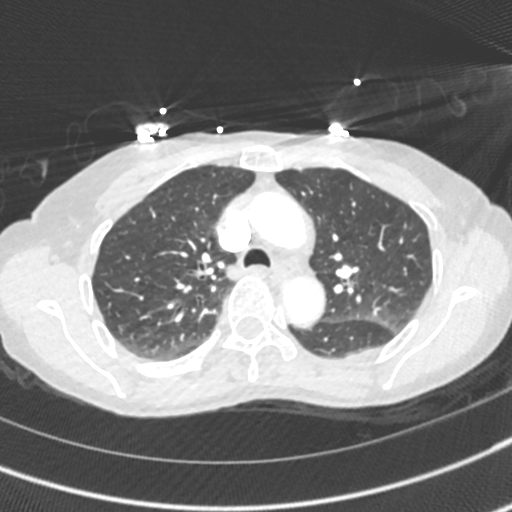
[im 278/377  soft-tissue]
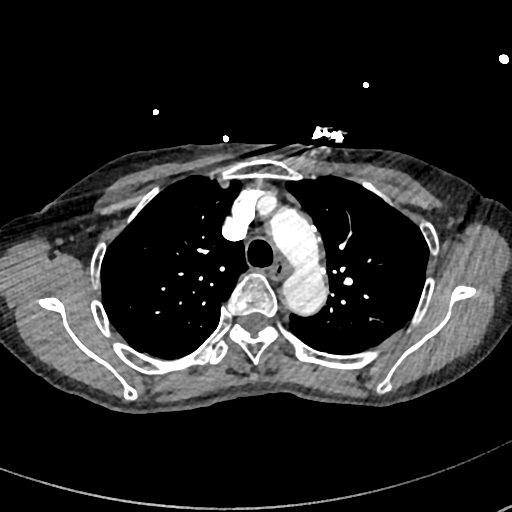
[im 311/377  lung]
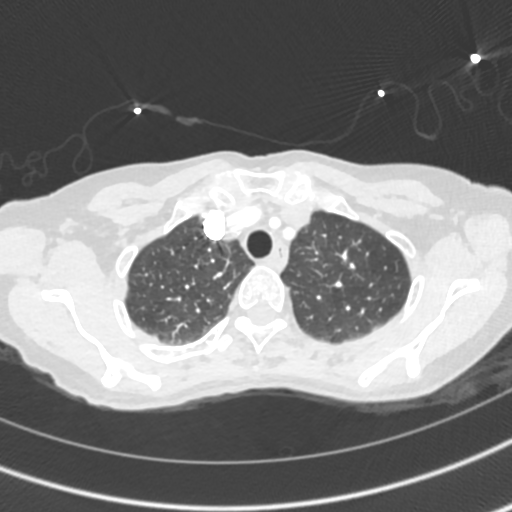
[im 327/377  soft-tissue]
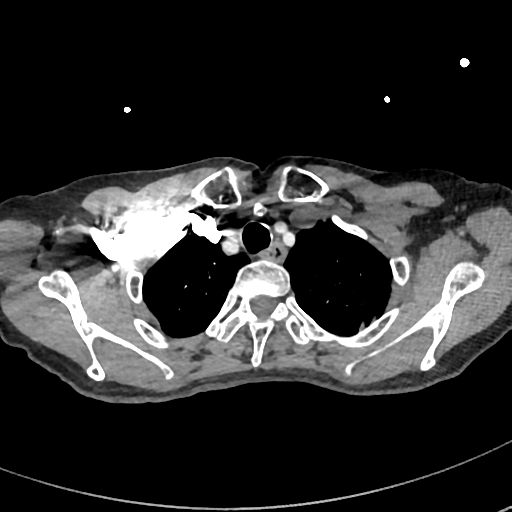
[im 360/377  lung]
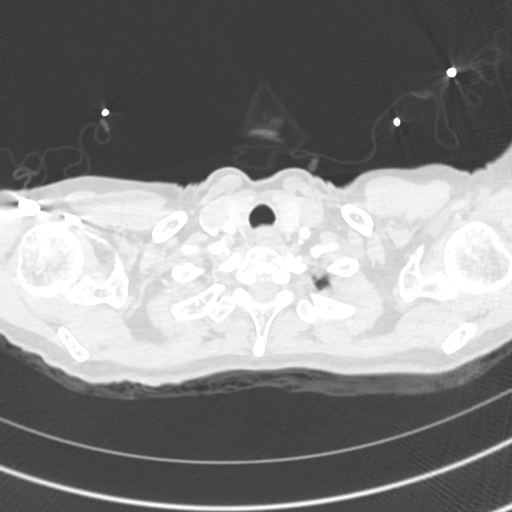

[Series 8: cor · coronal · 0.56mm/px · 3 of 107 slices shown]
[im 27/107  soft-tissue]
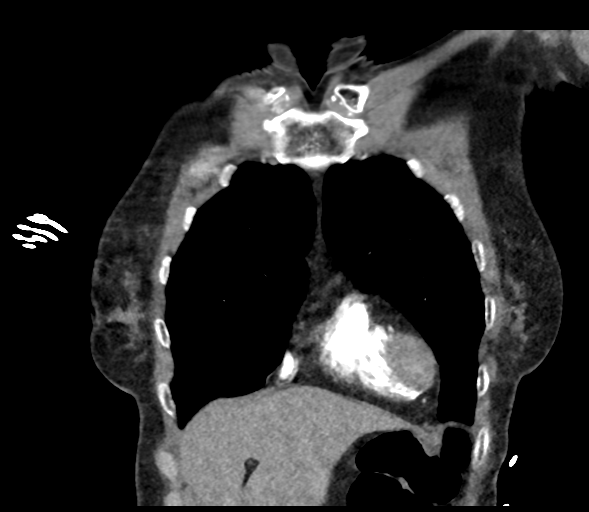
[im 54/107  soft-tissue]
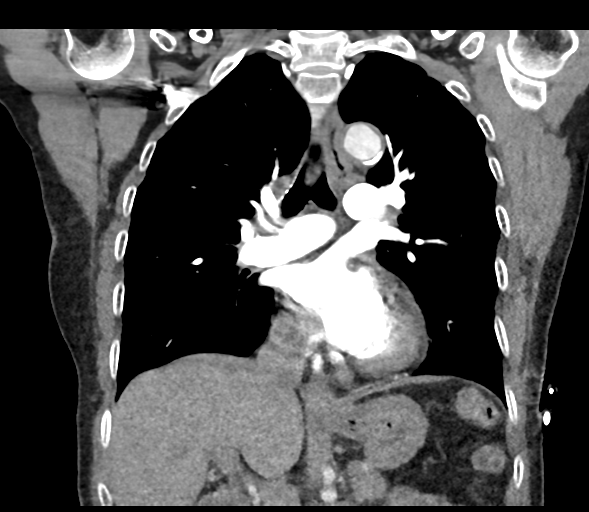
[im 80/107  soft-tissue]
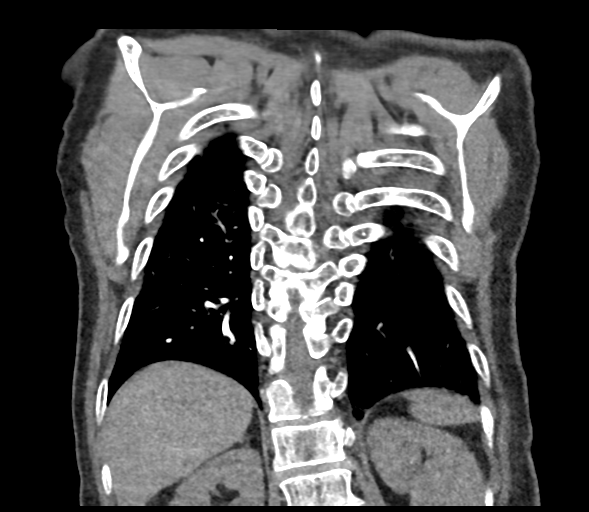

[18 of 46 positions shown; findings below may reference images not displayed]

FINDINGS: Cardiovascular: Small pericardial effusion is present. Coronary
artery calcifications are seen. There is homogeneous enhancement in
thoracic aorta. There are no intraluminal filling defects in the
pulmonary artery branches.

Mediastinum/Nodes: No significant lymphadenopathy seen. There is
inhomogeneous attenuation and coarse calcifications in the thyroid.

Lungs/Pleura: There is no focal pulmonary consolidation. Subtle
increased markings in the posterior lower lung fields may suggest
passive congestive change. Linear density in anterior right mid lung
fields may suggest scarring or subsegmental atelectasis. There is no
pleural effusion or pneumothorax.

Upper Abdomen: Unremarkable.

Musculoskeletal: Unremarkable.

Review of the MIP images confirms the above findings.
IMPRESSION: There is no evidence of pulmonary artery embolism. There is no
evidence of thoracic aortic dissection. There is no focal pulmonary
consolidation.

Coronary artery calcifications are seen. Small pericardial effusion
is present.

Increased markings are seen in the posterior lower lung fields which
may suggest passive congestive change or subsegmental atelectasis.

## 2023-07-20 ENCOUNTER — Other Ambulatory Visit: Payer: Self-pay | Admitting: Pulmonary Disease

## 2023-07-26 DIAGNOSIS — K5904 Chronic idiopathic constipation: Secondary | ICD-10-CM | POA: Diagnosis not present

## 2023-12-09 ENCOUNTER — Other Ambulatory Visit: Payer: Self-pay | Admitting: Pulmonary Disease

## 2024-01-04 ENCOUNTER — Other Ambulatory Visit: Payer: Self-pay | Admitting: Pulmonary Disease

## 2024-05-02 ENCOUNTER — Emergency Department (HOSPITAL_COMMUNITY)

## 2024-05-02 ENCOUNTER — Encounter (HOSPITAL_COMMUNITY): Payer: Self-pay

## 2024-05-02 ENCOUNTER — Other Ambulatory Visit: Payer: Self-pay

## 2024-05-02 ENCOUNTER — Emergency Department (HOSPITAL_COMMUNITY)
Admission: EM | Admit: 2024-05-02 | Discharge: 2024-05-02 | Disposition: A | Discharge status: Home / Self Care | Attending: Emergency Medicine | Admitting: Emergency Medicine

## 2024-05-02 DIAGNOSIS — R197 Diarrhea, unspecified: Secondary | ICD-10-CM | POA: Diagnosis not present

## 2024-05-02 DIAGNOSIS — D649 Anemia, unspecified: Secondary | ICD-10-CM | POA: Insufficient documentation

## 2024-05-02 DIAGNOSIS — R112 Nausea with vomiting, unspecified: Secondary | ICD-10-CM | POA: Diagnosis present

## 2024-05-02 DIAGNOSIS — R109 Unspecified abdominal pain: Secondary | ICD-10-CM

## 2024-05-02 DIAGNOSIS — R1084 Generalized abdominal pain: Secondary | ICD-10-CM | POA: Diagnosis not present

## 2024-05-02 LAB — COMPREHENSIVE METABOLIC PANEL WITH GFR
ALT: 11 U/L (ref 0–44)
AST: 26 U/L (ref 15–41)
Albumin: 3.8 g/dL (ref 3.5–5.0)
Alkaline Phosphatase: 89 U/L (ref 38–126)
Anion gap: 10 (ref 5–15)
BUN: 13 mg/dL (ref 8–23)
CO2: 25 mmol/L (ref 22–32)
Calcium: 9 mg/dL (ref 8.9–10.3)
Chloride: 107 mmol/L (ref 98–111)
Creatinine, Ser: 0.55 mg/dL (ref 0.44–1.00)
GFR, Estimated: >60 mL/min
Glucose, Bld: 153 mg/dL — ABNORMAL HIGH (ref 70–99)
Potassium: 3.6 mmol/L (ref 3.5–5.1)
Sodium: 142 mmol/L (ref 135–145)
Total Bilirubin: 0.4 mg/dL (ref 0.0–1.2)
Total Protein: 7.9 g/dL (ref 6.5–8.1)

## 2024-05-02 LAB — CBC
HCT: 34 % — ABNORMAL LOW (ref 36.0–46.0)
Hemoglobin: 11.1 g/dL — ABNORMAL LOW (ref 12.0–15.0)
MCH: 29.6 pg (ref 26.0–34.0)
MCHC: 32.6 g/dL (ref 30.0–36.0)
MCV: 90.7 fL (ref 80.0–100.0)
Platelets: 423 K/uL — ABNORMAL HIGH (ref 150–400)
RBC: 3.75 MIL/uL — ABNORMAL LOW (ref 3.87–5.11)
RDW: 12.6 % (ref 11.5–15.5)
WBC: 8.4 K/uL (ref 4.0–10.5)
nRBC: 0 % (ref 0.0–0.2)

## 2024-05-02 LAB — LIPASE, BLOOD: Lipase: 16 U/L (ref 11–51)

## 2024-05-02 MED ORDER — ONDANSETRON HCL 4 MG/2ML IJ SOLN
4.0000 mg | Freq: Once | INTRAMUSCULAR | Status: AC
  Administered 2024-05-02: 4 mg via INTRAVENOUS
  Filled 2024-05-02: qty 2

## 2024-05-02 MED ORDER — FENTANYL CITRATE (PF) 50 MCG/ML IJ SOSY
50.0000 ug | PREFILLED_SYRINGE | Freq: Once | INTRAMUSCULAR | Status: AC
  Administered 2024-05-02: 50 ug via INTRAVENOUS
  Filled 2024-05-02: qty 1

## 2024-05-02 MED ORDER — IOHEXOL 300 MG/ML  SOLN
75.0000 mL | Freq: Once | INTRAMUSCULAR | Status: AC | PRN
  Administered 2024-05-02: 75 mL via INTRAVENOUS

## 2024-05-02 MED ORDER — SODIUM CHLORIDE 0.9 % IV BOLUS (SEPSIS)
1000.0000 mL | Freq: Once | INTRAVENOUS | Status: AC
  Administered 2024-05-02: 1000 mL via INTRAVENOUS

## 2024-05-02 NOTE — ED Provider Notes (Signed)
 " [place] EMERGENCY DEPARTMENT AT [facility] Provider Note   CSN: [id number] Arrival date & time: 05/02/24  9675     Patient presents with: Abdominal Pain   Katherine York is a 69 y.o. female.   The history is provided by the patient and a relative. The history is limited by a language barrier. Language interpreter used: Patient prefers son to interpret, declines medical interpreter.  Patient presents with abdominal pain, nausea vomiting diarrhea over the past day.  No fevers.  No chest pain or shortness of breath.  No recent travel.  Son reports the patient has had previous abdominal surgery but is unclear the exact procedure     Past Medical History:  Diagnosis Date   Bowel obstruction (HCC)    Bronchitis     Prior to Admission medications  Medication Sig Start Date End Date Taking? Authorizing Provider  albuterol  (VENTOLIN  HFA) 108 (90 Base) MCG/ACT inhaler Inhale 2 puffs into the lungs every 6 (six) hours as needed for wheezing. 06/21/22 04/21/33  Hunsucker, Donnice SAUNDERS, MD  fluticasone  furoate-vilanterol (BREO ELLIPTA ) 100-25 MCG/ACT AEPB INHALE 1 PUFF EVERY DAY 07/20/23   Hunsucker, Donnice SAUNDERS, MD  ipratropium-albuterol  (DUONEB) 0.5-2.5 (3) MG/3ML SOLN Take 3 mLs by nebulization every 6 (six) hours as needed. 06/21/22   Hunsucker, Donnice SAUNDERS, MD  Melatonin 5 MG CAPS Take 1 capsule (5 mg total) by mouth at bedtime as needed (for sleep). 06/16/22   Nivia Colon, PA-C  valACYclovir  (VALTREX ) 1000 MG tablet Take 1 tablet (1,000 mg total) by mouth 3 (three) times daily. 10/07/15   Nevelyn Nat Norris, PA-C    Allergies: Patient has no known allergies.    Review of Systems  Cardiovascular:  Negative for chest pain.  Gastrointestinal:  Positive for abdominal pain, diarrhea, nausea and vomiting. Negative for blood in stool.    Updated Vital Signs BP 121/66 (BP Location: Left Arm)   Pulse 73   Temp 98.6 F (37 C)   Resp 13   Ht 1.626 m (5' 4)   Wt 36.7 kg   SpO2  96%   BMI 13.89 kg/m   Physical Exam CONSTITUTIONAL: Elderly, no acute distress HEAD: Normocephalic/atraumatic EYES: EOMI/PERRL, no icterus ENMT: Mucous membranes moist NECK: supple no meningeal signs CV: S1/S2 noted, no murmurs/rubs/gallops noted LUNGS: Lungs are clear to auscultation bilaterally, no apparent distress ABDOMEN: soft, moderate diffuse tenderness, no rebound or guarding, bowel sounds noted throughout abdomen Well-healed midline scar noted NEURO: Pt is awake/alert/appropriate, moves all extremitiesx4.  No facial droop.   SKIN: warm, color normal  (all labs ordered are listed, but only abnormal results are displayed) Labs Reviewed  COMPREHENSIVE METABOLIC PANEL WITH GFR - Abnormal; Notable for the following components:      Result Value   Glucose, Bld 153 (*)    All other components within normal limits  CBC - Abnormal; Notable for the following components:   RBC 3.75 (*)    Hemoglobin 11.1 (*)    HCT 34.0 (*)    Platelets 423 (*)    All other components within normal limits  LIPASE, BLOOD    EKG: EKG Interpretation Date/Time:  Thursday May 02 2024 03:55:43 EST Ventricular Rate:  85 PR Interval:  179 QRS Duration:  86 QT Interval:  374 QTC Calculation: 445 R Axis:   81  Text Interpretation: Sinus rhythm Borderline right axis deviation Nonspecific T abnormalities, lateral leads Confirmed by Midge Golas ([id number]) on 05/02/2024 3:56:32 AM  Radiology: CT ABDOMEN PELVIS W CONTRAST  Result Date: 05/02/2024 EXAM: CT ABDOMEN AND PELVIS WITH CONTRAST 05/02/2024 05:32:33 AM TECHNIQUE: CT of the abdomen and pelvis was performed with the administration of 75 mL of iohexol  (OMNIPAQUE ) 300 MG/ML solution. Multiplanar reformatted images are provided for review. Automated exposure control, iterative reconstruction, and/or weight-based adjustment of the mA/kV was utilized to reduce the radiation dose to as low as reasonably achievable. COMPARISON: CT with intravenous  and oral contrast 03/10/2020 and 08/26/2013. CLINICAL HISTORY: Abdominal pain, acute, nonlocalized; vomiting and diarrhea. FINDINGS: LOWER CHEST: There is bronchial thickening in both lower lobes. Posterior hazy opacities are noted, probably due to atelectasis, less likely pneumonia. Lung bases are otherwise clear. The cardiac size is normal. LIVER: The liver is unremarkable. GALLBLADDER AND BILE DUCTS: Gallbladder is unremarkable. No biliary ductal dilatation. SPLEEN: No acute abnormality. PANCREAS: No acute abnormality. ADRENAL GLANDS: No adrenal mass. KIDNEYS, URETERS AND BLADDER: Left kidney is unremarkable. The right kidney demonstrates a few scattered subcentimeter Bosniak 2 cortical cysts. These are too small to characterize but are not significantly changed. No follow-up imaging is recommended. Per consensus, no follow-up is needed for simple Bosniak type 1 and 2 renal cysts, unless the patient has a malignancy history or risk factors. There is no urinary stone or obstruction. No perinephric or periureteral stranding. There is mild smooth thickening in the floor of the bladder with faint calcification along the mucosal surface. This could be due to chronic inflammation or sequela of prior infection. Correlate with urinalysis for potential significance if clinically warranted, but this was also present in 2021. GI AND BOWEL: There is an increasingly thickened appearance of the gastric pylorus. A patent gastrojejunostomy is again noted just proximal to the pylorus anteriorly, seen previously. The duodenal bulb and descending duodenum are markedly dilated and filled with stool-like material, with a maximum diameter of 5.3 cm. This is similar to what was seen in 2015 and does not appear to be due to compression by the SMA as there is a normal aortomesenteric angle. The etiology may be related to thickened folds in the fourth duodenal segment and proximal jejunum. No mesenteric valvuloliposis seen. Consider  endoscopy for further evaluation. The remaining small bowel demonstrates nondilated fluid filling without other focal abnormality. The appendix measures 10 mm, but there are no inflammatory changes. There is a stone in the appendiceal tip. There are thickened folds versus underdistention in the splenic flexure and descending colon. Correlate clinically for underlying colitis. PERITONEUM AND RETROPERITONEUM: No ascites. No free air. No free hemorrhage. No localizing inflammatory process including around the stomach and duodenum. VASCULATURE: Aorta is normal in caliber. There is patchy aortic calcific plaque without aneurysm or dissection. LYMPH NODES: No lymphadenopathy. REPRODUCTIVE ORGANS: The uterus and ovaries are not enlarged. BONES AND SOFT TISSUES: There is levoscoliosis, osteopenia and degenerative change of the lumbar spine. No acute or significant osseous findings. No focal soft tissue abnormality. IMPRESSION: 1. Markedly dilated duodenal bulb and descending duodenum filled with stool-like material, similar to 2015, with suspected thickened folds in the fourth duodenal segment and proximal jejunum as a possible etiology; consider endoscopy for further evaluation. 2. Thickened folds versus underdistention in the splenic flexure and descending colon, which may reflect colitis. 3. Increasingly thickened appearance of the gastric bypass . 4. Patent gastrojejunostomy . 5. Posterior opacities in the lower lobes are most likely due to atelectasis, less likely pneumonia, with lower lobe bronchitis. Electronically signed by: Francis Quam MD 05/02/2024 06:06 AM EST RP Workstation: [id number]     Procedures   Medications  Ordered in the ED  fentaNYL  (SUBLIMAZE ) injection 50 mcg (50 mcg Intravenous Given 05/02/24 0457)  ondansetron  (ZOFRAN ) injection 4 mg (4 mg Intravenous Given 05/02/24 0458)  sodium chloride  0.9 % bolus 1,000 mL (0 mLs Intravenous Stopped 05/02/24 0527)  iohexol  (OMNIPAQUE ) 300 MG/ML solution  75 mL (75 mLs Intravenous Contrast Given 05/02/24 0519)  fentaNYL  (SUBLIMAZE ) injection 50 mcg (50 mcg Intravenous Given 05/02/24 0733)    Clinical Course as of 05/02/24 0737  Thu May 02, 2024  0523 Patient is a [place] immigrant (no recent travel) presented with nausea vomiting diarrhea.  She appears uncomfortable and has focal abdominal tenderness We will proceed with CT imaging [DW]  0704 Patient with thickened duodenum on CT imaging, which has been seen previously.  This may be contributing to her symptoms.  Patient is well-known to Dr. Rollin with gastroenterology. Will plan to consult GI Patient tolerating p.o., but still having abdominal pain [DW]  0735 Discussed the case at length with Dr. Rollin He has reviewed the imaging. if patient is improving, no vomiting she can be discharged. He reports patient has a long history of chronic abdominal pain. He will see the patient on January 28 [DW]    Clinical Course User Index [DW] Midge Golas, MD                                 Medical Decision Making Amount and/or Complexity of Data Reviewed Labs: ordered. Radiology: ordered.  Risk Prescription drug management.   This patient presents to the ED for concern of abdominal pain with vomiting & diarrhea, this involves an extensive number of treatment options, and is a complaint that carries with it a high risk of complications and morbidity.  The differential diagnosis includes but is not limited to cholecystitis, cholelithiasis, pancreatitis, gastritis, peptic ulcer disease, appendicitis, bowel obstruction, bowel perforation, diverticulitis, AAA, ischemic bowel, ACS   Comorbidities that complicate the patient evaluation: Patients presentation is complicated by their history of previous abdominal surgery  Social Determinants of Health: Patients limited English proficiency  increases the complexity of managing their presentation  Additional history obtained: Additional history  obtained from family Records reviewed outpatient records reviewed  Lab Tests: I Ordered, and personally interpreted labs.  The pertinent results include: Mild anemia  Imaging Studies ordered: I ordered imaging studies including CT scan abdomen pelvis  I independently visualized and interpreted imaging which showed duodenal dilation I agree with the radiologist interpretation   Medicines ordered and prescription drug management: I ordered medication including fentanyl  for pain Reevaluation of the patient after these medicines showed that the patient    improved   Consultations Obtained: I requested consultation with the consultant gastroenterology, and discussed  findings as well as pertinent plan - they recommend: Follow-up next week  Reevaluation: After the interventions noted above, I reevaluated the patient and found that they have :improved  Complexity of problems addressed: Patients presentation is most consistent with  acute presentation with potential threat to life or bodily function  Disposition: After consideration of the diagnostic results and the patients response to treatment,  I feel that the patent would benefit from discharge  .   Improved, taking p.o. fluids, no signs of acute abdominal emergency     Final diagnoses:  Abdominal pain, unspecified abdominal location  Nausea vomiting and diarrhea    ED Discharge Orders     None  Midge Golas, MD 05/02/24 [phone]  "

## 2024-05-02 NOTE — ED Triage Notes (Signed)
 Pt BIB [facility] from home for abdominal pain and N/V/D since 1200 1/21. Pain has worsened tonight.  124/80 HR 74 99 RA
